# Patient Record
Sex: Male | Born: 1955 | Race: White | Hispanic: No | Marital: Married | Smoking: Never smoker
Health system: Southern US, Community
[De-identification: ages and names within clinical notes are randomized; demographics above are authoritative.]

## PROBLEM LIST (undated history)

## (undated) DIAGNOSIS — N138 Other obstructive and reflux uropathy: Secondary | ICD-10-CM

## (undated) DIAGNOSIS — E785 Hyperlipidemia, unspecified: Secondary | ICD-10-CM

## (undated) DIAGNOSIS — N5201 Erectile dysfunction due to arterial insufficiency: Secondary | ICD-10-CM

## (undated) DIAGNOSIS — R918 Other nonspecific abnormal finding of lung field: Secondary | ICD-10-CM

## (undated) DIAGNOSIS — F5104 Psychophysiologic insomnia: Secondary | ICD-10-CM

## (undated) DIAGNOSIS — M1990 Unspecified osteoarthritis, unspecified site: Secondary | ICD-10-CM

## (undated) DIAGNOSIS — K5730 Diverticulosis of large intestine without perforation or abscess without bleeding: Secondary | ICD-10-CM

## (undated) DIAGNOSIS — I700 Atherosclerosis of aorta: Secondary | ICD-10-CM

## (undated) DIAGNOSIS — I2510 Atherosclerotic heart disease of native coronary artery without angina pectoris: Secondary | ICD-10-CM

## (undated) HISTORY — DX: Psychophysiologic insomnia: F51.04

## (undated) HISTORY — DX: Diverticulosis of large intestine without perforation or abscess without bleeding: K57.30

## (undated) HISTORY — DX: Atherosclerotic heart disease of native coronary artery without angina pectoris: I25.10

## (undated) HISTORY — PX: APPENDECTOMY: SHX54

## (undated) HISTORY — PX: ANKLE SURGERY: SHX546

## (undated) HISTORY — DX: Hyperlipidemia, unspecified: E78.5

## (undated) HISTORY — DX: Other nonspecific abnormal finding of lung field: R91.8

## (undated) HISTORY — PX: CARPAL TUNNEL RELEASE: SHX101

## (undated) HISTORY — DX: Unspecified osteoarthritis, unspecified site: M19.90

## (undated) HISTORY — DX: Benign prostatic hyperplasia with lower urinary tract symptoms: N13.8

## (undated) HISTORY — PX: KNEE SURGERY: SHX244

## (undated) HISTORY — PX: CHOLECYSTECTOMY: SHX55

## (undated) HISTORY — DX: Atherosclerosis of aorta: I70.0

## (undated) HISTORY — DX: Erectile dysfunction due to arterial insufficiency: N52.01

---

## 1998-03-19 ENCOUNTER — Other Ambulatory Visit: Admission: RE | Admit: 1998-03-19 | Discharge: 1998-03-19 | Payer: Self-pay | Admitting: Family Medicine

## 2000-11-24 ENCOUNTER — Encounter: Admission: RE | Admit: 2000-11-24 | Discharge: 2001-02-08 | Payer: Self-pay | Admitting: Orthopaedic Surgery

## 2000-12-30 ENCOUNTER — Ambulatory Visit (HOSPITAL_COMMUNITY): Admission: RE | Admit: 2000-12-30 | Discharge: 2000-12-30 | Payer: Self-pay

## 2001-01-26 ENCOUNTER — Ambulatory Visit (HOSPITAL_BASED_OUTPATIENT_CLINIC_OR_DEPARTMENT_OTHER): Admission: RE | Admit: 2001-01-26 | Discharge: 2001-01-26 | Payer: Self-pay | Admitting: Orthopaedic Surgery

## 2001-02-08 ENCOUNTER — Encounter: Admission: RE | Admit: 2001-02-08 | Discharge: 2001-03-05 | Payer: Self-pay | Admitting: Orthopaedic Surgery

## 2001-07-11 ENCOUNTER — Inpatient Hospital Stay (HOSPITAL_COMMUNITY): Admission: AD | Admit: 2001-07-11 | Discharge: 2001-07-14 | Payer: Self-pay | Admitting: Psychiatry

## 2001-07-11 ENCOUNTER — Inpatient Hospital Stay (HOSPITAL_COMMUNITY): Admission: EM | Admit: 2001-07-11 | Discharge: 2001-07-11 | Payer: Self-pay

## 2001-07-20 ENCOUNTER — Encounter: Admission: RE | Admit: 2001-07-20 | Discharge: 2001-07-20 | Payer: Self-pay | Admitting: Psychiatry

## 2001-08-12 ENCOUNTER — Encounter: Admission: RE | Admit: 2001-08-12 | Discharge: 2001-08-12 | Payer: Self-pay | Admitting: *Deleted

## 2002-07-07 ENCOUNTER — Encounter (INDEPENDENT_AMBULATORY_CARE_PROVIDER_SITE_OTHER): Payer: Self-pay | Admitting: *Deleted

## 2002-07-07 ENCOUNTER — Ambulatory Visit (HOSPITAL_COMMUNITY): Admission: RE | Admit: 2002-07-07 | Discharge: 2002-07-07 | Payer: Self-pay | Admitting: Gastroenterology

## 2002-08-19 ENCOUNTER — Ambulatory Visit (HOSPITAL_COMMUNITY): Admission: RE | Admit: 2002-08-19 | Discharge: 2002-08-19 | Payer: Self-pay | Admitting: Otolaryngology

## 2002-08-19 ENCOUNTER — Encounter: Payer: Self-pay | Admitting: Otolaryngology

## 2003-12-30 ENCOUNTER — Inpatient Hospital Stay (HOSPITAL_COMMUNITY): Admission: AD | Admit: 2003-12-30 | Discharge: 2004-01-01 | Payer: Self-pay | Admitting: Internal Medicine

## 2003-12-30 ENCOUNTER — Encounter: Payer: Self-pay | Admitting: Emergency Medicine

## 2004-01-01 ENCOUNTER — Encounter (INDEPENDENT_AMBULATORY_CARE_PROVIDER_SITE_OTHER): Payer: Self-pay | Admitting: *Deleted

## 2004-04-21 ENCOUNTER — Emergency Department (HOSPITAL_COMMUNITY): Admission: EM | Admit: 2004-04-21 | Discharge: 2004-04-21 | Payer: Self-pay | Admitting: Emergency Medicine

## 2004-06-25 ENCOUNTER — Ambulatory Visit (HOSPITAL_COMMUNITY): Admission: RE | Admit: 2004-06-25 | Discharge: 2004-06-25 | Payer: Self-pay | Admitting: Orthopaedic Surgery

## 2004-06-25 ENCOUNTER — Ambulatory Visit (HOSPITAL_BASED_OUTPATIENT_CLINIC_OR_DEPARTMENT_OTHER): Admission: RE | Admit: 2004-06-25 | Discharge: 2004-06-25 | Payer: Self-pay | Admitting: Orthopaedic Surgery

## 2006-06-19 ENCOUNTER — Ambulatory Visit: Payer: Self-pay | Admitting: Cardiology

## 2006-07-06 ENCOUNTER — Ambulatory Visit: Payer: Self-pay | Admitting: Cardiology

## 2006-07-08 ENCOUNTER — Inpatient Hospital Stay (HOSPITAL_BASED_OUTPATIENT_CLINIC_OR_DEPARTMENT_OTHER): Admission: RE | Admit: 2006-07-08 | Discharge: 2006-07-08 | Payer: Self-pay | Admitting: Cardiovascular Disease

## 2006-07-08 ENCOUNTER — Ambulatory Visit: Payer: Self-pay | Admitting: Cardiovascular Disease

## 2006-08-18 ENCOUNTER — Emergency Department (HOSPITAL_COMMUNITY): Admission: EM | Admit: 2006-08-18 | Discharge: 2006-08-18 | Payer: Self-pay | Admitting: Emergency Medicine

## 2006-08-19 ENCOUNTER — Ambulatory Visit (HOSPITAL_COMMUNITY): Admission: RE | Admit: 2006-08-19 | Discharge: 2006-08-19 | Payer: Self-pay | Admitting: Family Medicine

## 2006-09-10 ENCOUNTER — Encounter (INDEPENDENT_AMBULATORY_CARE_PROVIDER_SITE_OTHER): Payer: Self-pay | Admitting: Specialist

## 2006-09-10 ENCOUNTER — Ambulatory Visit (HOSPITAL_COMMUNITY): Admission: RE | Admit: 2006-09-10 | Discharge: 2006-09-11 | Payer: Self-pay | Admitting: Surgery

## 2006-09-24 ENCOUNTER — Ambulatory Visit (HOSPITAL_COMMUNITY): Admission: RE | Admit: 2006-09-24 | Discharge: 2006-09-24 | Payer: Self-pay | Admitting: Surgery

## 2008-03-07 ENCOUNTER — Ambulatory Visit (HOSPITAL_BASED_OUTPATIENT_CLINIC_OR_DEPARTMENT_OTHER): Admission: RE | Admit: 2008-03-07 | Discharge: 2008-03-07 | Payer: Self-pay | Admitting: Orthopaedic Surgery

## 2008-07-19 ENCOUNTER — Ambulatory Visit (HOSPITAL_COMMUNITY): Admission: RE | Admit: 2008-07-19 | Discharge: 2008-07-19 | Payer: Self-pay | Admitting: Gastroenterology

## 2008-08-15 ENCOUNTER — Ambulatory Visit (HOSPITAL_BASED_OUTPATIENT_CLINIC_OR_DEPARTMENT_OTHER): Admission: RE | Admit: 2008-08-15 | Discharge: 2008-08-15 | Payer: Self-pay | Admitting: Orthopaedic Surgery

## 2008-09-04 IMAGING — CR DG ABDOMEN ACUTE W/ 1V CHEST
3 series · 3 of 3 positions shown · non-contrast
Comparison: none

CLINICAL DATA: Epigastric pain.
 ABDOMEN ACUTE WITH PA CHEST:
 Chest:  A single view of the chest shows the lungs to be clear. The heart is within normal limits in size.  
 Abdomen:  Supine and erect views of the abdomen show both large and small bowel gas to be present.  No obstruction is seen although there are a few scattered air-fluid levels throughout small bowel.  There is a moderate amount of feces throughout the colon.  No free air is seen on the erect view of the chest.  No opaque calculi are noted.

[w chest pa]
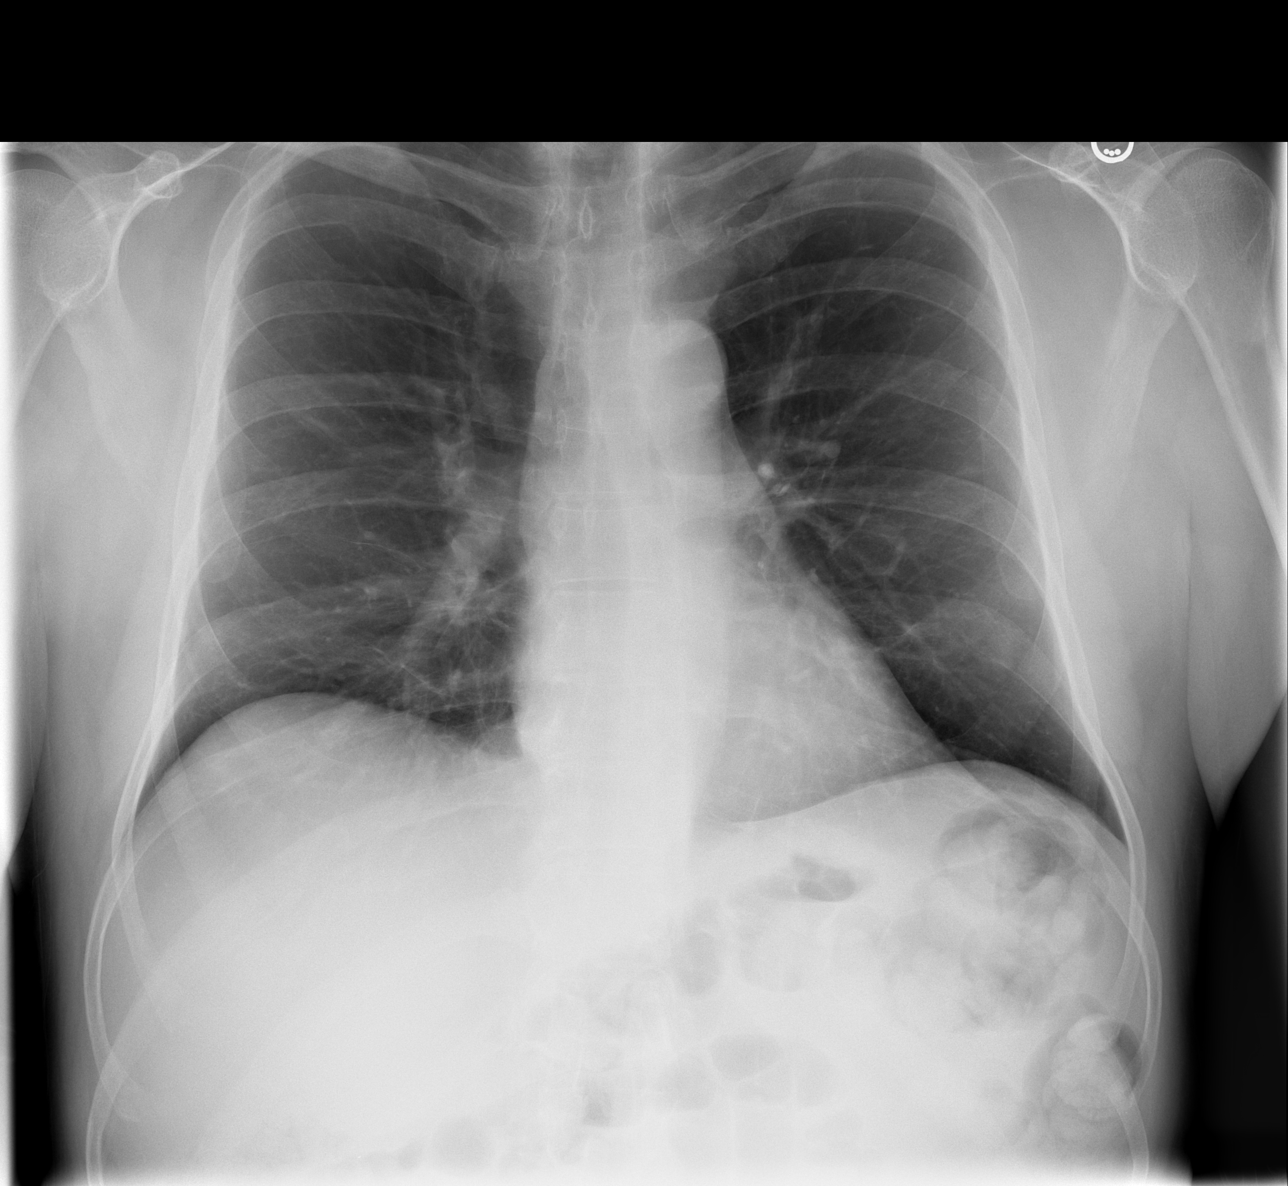

[w abdomen upright]
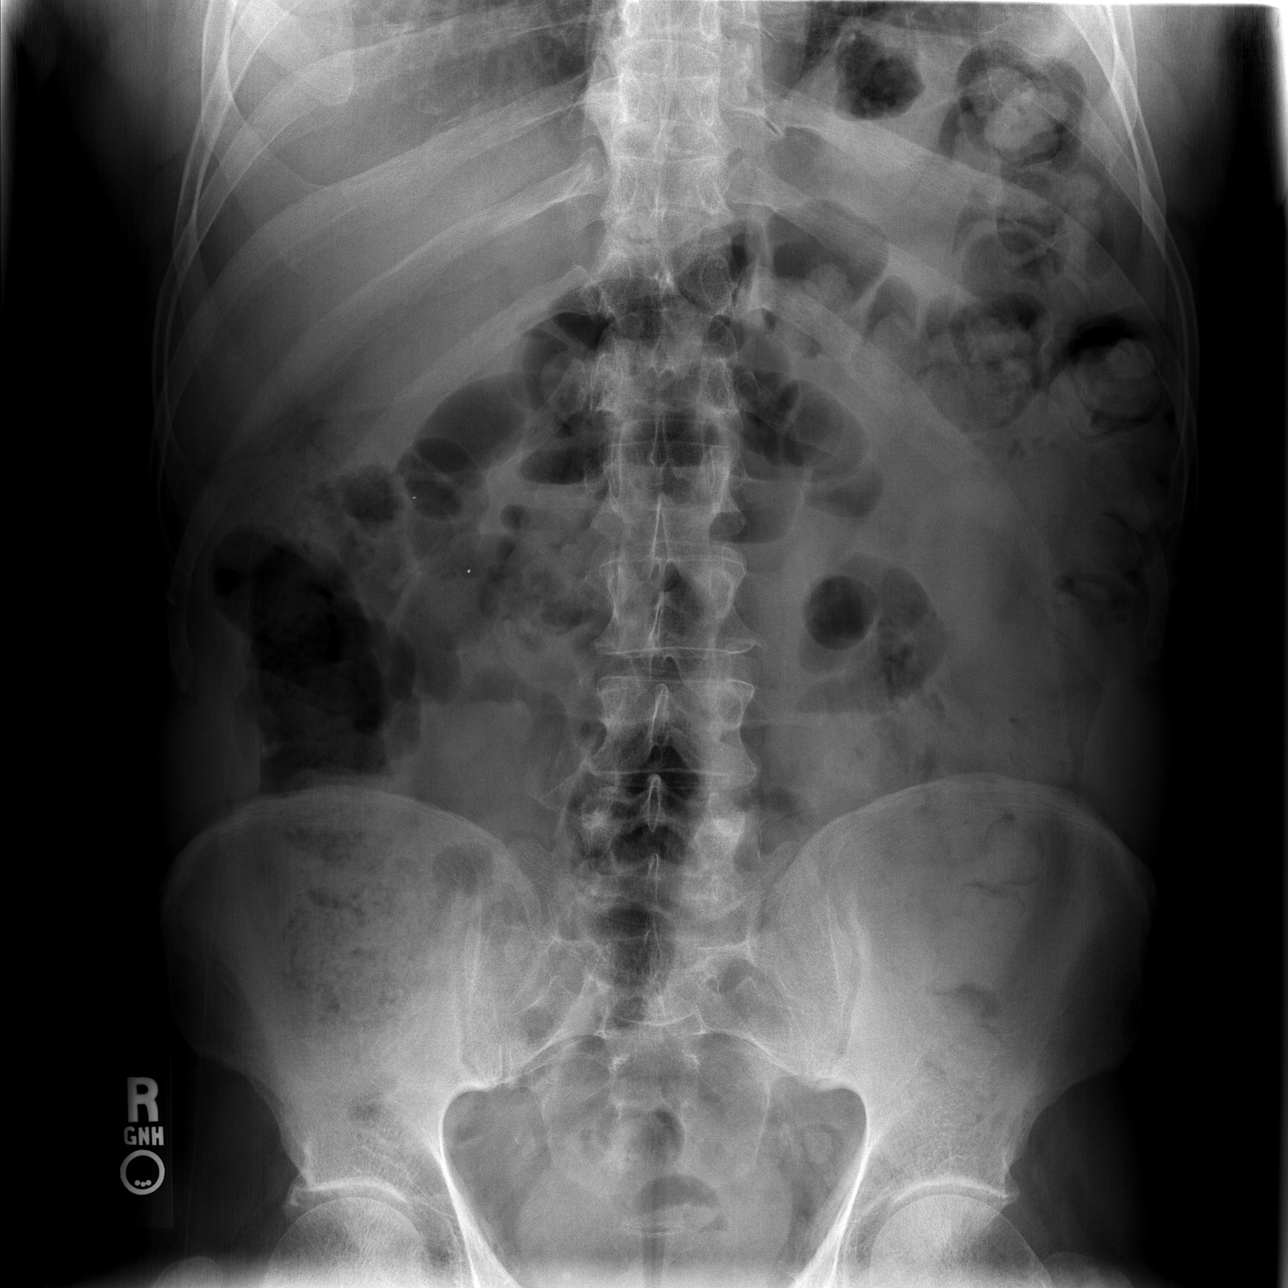

[t abdomen supine]
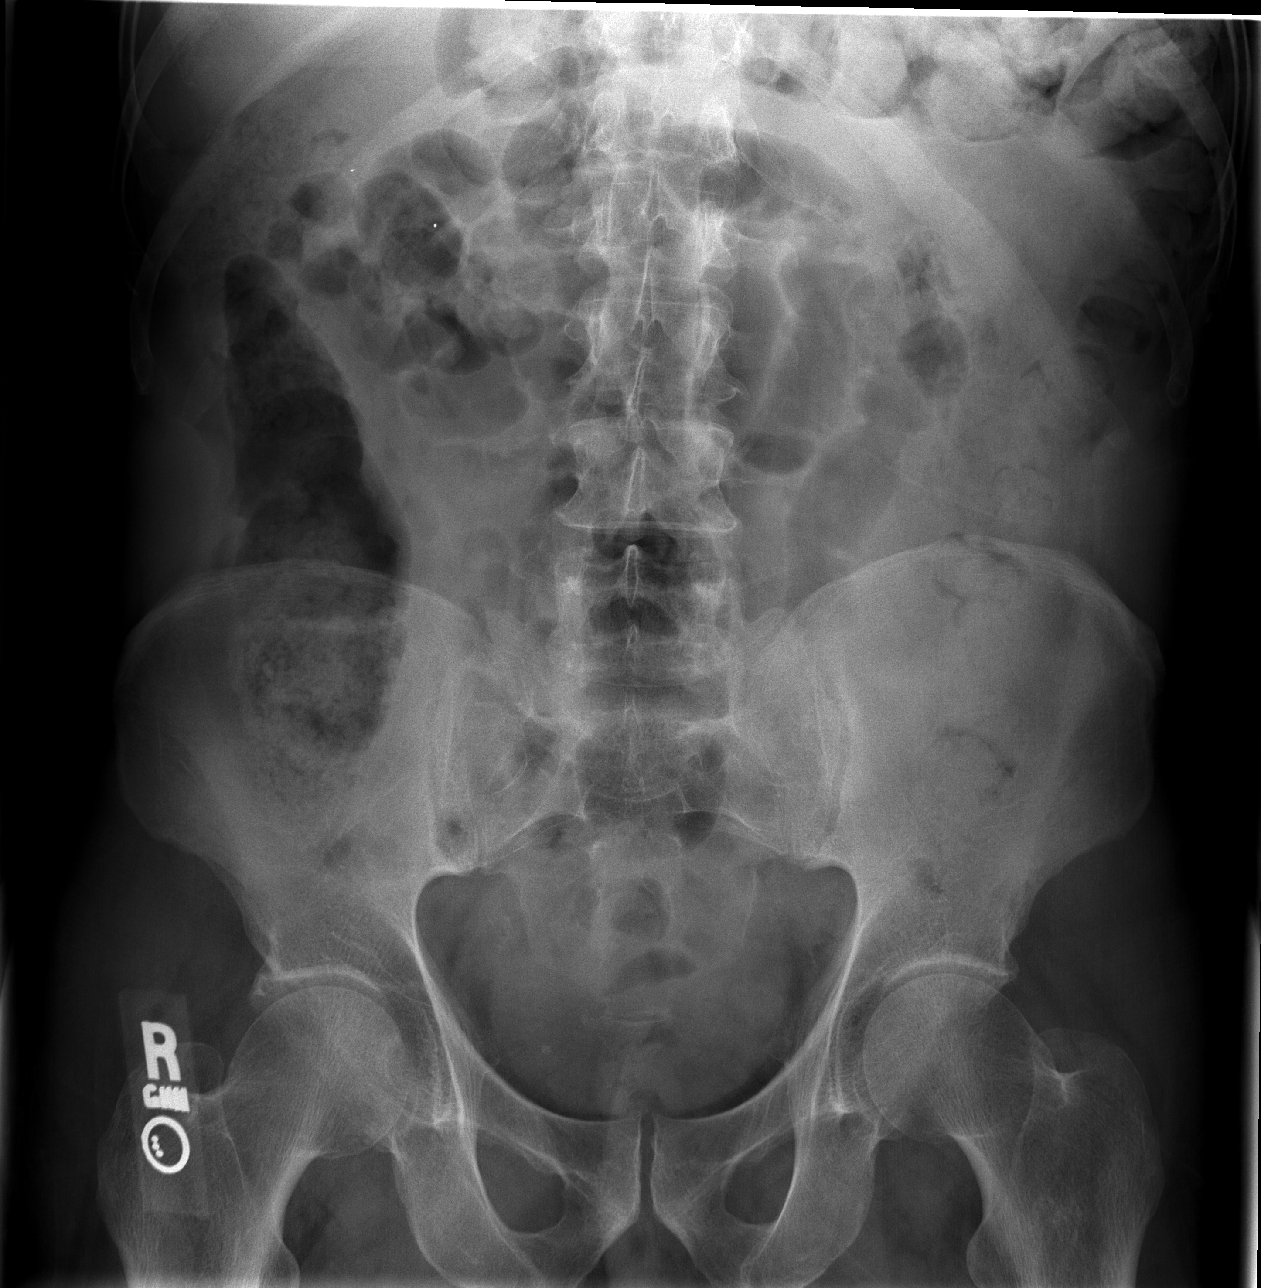

[3 of 3 positions shown; findings below may reference images not displayed]

IMPRESSION: 1.  No active lung disease.
 2.  No obstruction or free air.  Few scattered air-fluid levels in small bowel of questionable significance.  Question ileus.

## 2009-09-13 ENCOUNTER — Ambulatory Visit (HOSPITAL_BASED_OUTPATIENT_CLINIC_OR_DEPARTMENT_OTHER): Admission: RE | Admit: 2009-09-13 | Discharge: 2009-09-13 | Payer: Self-pay | Admitting: Orthopaedic Surgery

## 2010-01-24 ENCOUNTER — Observation Stay (HOSPITAL_COMMUNITY): Admission: EM | Admit: 2010-01-24 | Discharge: 2010-01-25 | Payer: Self-pay | Admitting: Emergency Medicine

## 2010-01-24 ENCOUNTER — Ambulatory Visit: Payer: Self-pay | Admitting: Internal Medicine

## 2010-03-07 ENCOUNTER — Emergency Department (HOSPITAL_COMMUNITY): Admission: EM | Admit: 2010-03-07 | Discharge: 2010-03-08 | Payer: Self-pay | Admitting: Emergency Medicine

## 2010-03-23 ENCOUNTER — Emergency Department (HOSPITAL_COMMUNITY): Admission: EM | Admit: 2010-03-23 | Discharge: 2010-03-24 | Payer: Self-pay | Admitting: Emergency Medicine

## 2010-03-23 ENCOUNTER — Ambulatory Visit (HOSPITAL_COMMUNITY): Admission: RE | Admit: 2010-03-23 | Discharge: 2010-03-23 | Payer: Self-pay | Admitting: Psychiatry

## 2010-03-24 ENCOUNTER — Inpatient Hospital Stay (HOSPITAL_COMMUNITY): Admission: AD | Admit: 2010-03-24 | Discharge: 2010-03-25 | Payer: Self-pay | Admitting: Psychiatry

## 2010-03-24 ENCOUNTER — Ambulatory Visit: Payer: Self-pay | Admitting: Psychiatry

## 2010-11-09 LAB — CBC
Hemoglobin: 12.6 g/dL — ABNORMAL LOW (ref 13.0–17.0)
MCH: 34.5 pg — ABNORMAL HIGH (ref 26.0–34.0)
MCV: 98.8 fL (ref 78.0–100.0)
RBC: 3.65 MIL/uL — ABNORMAL LOW (ref 4.22–5.81)
RDW: 13.7 % (ref 11.5–15.5)
WBC: 7.7 10*3/uL (ref 4.0–10.5)

## 2010-11-09 LAB — DIFFERENTIAL
Basophils Absolute: 0 10*3/uL (ref 0.0–0.1)
Eosinophils Relative: 2 % (ref 0–5)
Monocytes Absolute: 0.8 10*3/uL (ref 0.1–1.0)
Monocytes Relative: 11 % (ref 3–12)
Neutrophils Relative %: 68 % (ref 43–77)

## 2010-11-09 LAB — URINALYSIS, ROUTINE W REFLEX MICROSCOPIC
Bilirubin Urine: NEGATIVE
Glucose, UA: NEGATIVE mg/dL
Ketones, ur: 15 mg/dL — AB
Leukocytes, UA: NEGATIVE
Protein, ur: NEGATIVE mg/dL
Specific Gravity, Urine: 1.013 (ref 1.005–1.030)
pH: 6.5 (ref 5.0–8.0)

## 2010-11-09 LAB — RAPID URINE DRUG SCREEN, HOSP PERFORMED
Barbiturates: NOT DETECTED
Benzodiazepines: NOT DETECTED

## 2010-11-09 LAB — BASIC METABOLIC PANEL
CO2: 27 mEq/L (ref 19–32)
Chloride: 100 mEq/L (ref 96–112)
Creatinine, Ser: 0.87 mg/dL (ref 0.4–1.5)
GFR calc Af Amer: >60 mL/min (ref >60)
Potassium: 3.9 mEq/L (ref 3.5–5.1)
Sodium: 134 mEq/L — ABNORMAL LOW (ref 135–145)

## 2010-11-09 LAB — ETHANOL: Alcohol, Ethyl (B): <5 mg/dL (ref 0–10)

## 2010-11-09 LAB — URINE MICROSCOPIC-ADD ON

## 2010-11-10 LAB — BASIC METABOLIC PANEL
BUN: 17 mg/dL (ref 6–23)
GFR calc Af Amer: >60 mL/min (ref >60)
GFR calc non Af Amer: >60 mL/min (ref >60)
Glucose, Bld: 97 mg/dL (ref 70–99)

## 2010-11-10 LAB — POCT HEMOGLOBIN-HEMACUE: Hemoglobin: 14.4 g/dL (ref 13.0–17.0)

## 2010-11-11 LAB — DIFFERENTIAL
Eosinophils Relative: 1 % (ref 0–5)
Lymphocytes Relative: 21 % (ref 12–46)
Lymphs Abs: 1.2 10*3/uL (ref 0.7–4.0)
Monocytes Absolute: 0.6 10*3/uL (ref 0.1–1.0)
Monocytes Relative: 11 % (ref 3–12)

## 2010-11-11 LAB — LIPID PANEL
Cholesterol: 173 mg/dL (ref 0–200)
LDL Cholesterol: 81 mg/dL (ref 0–99)
Triglycerides: 62 mg/dL (ref <150)
VLDL: 12 mg/dL (ref 0–40)

## 2010-11-11 LAB — BASIC METABOLIC PANEL
BUN: 8 mg/dL (ref 6–23)
Chloride: 102 mEq/L (ref 96–112)
Chloride: 96 mEq/L (ref 96–112)
GFR calc Af Amer: >60 mL/min (ref >60)
GFR calc non Af Amer: >60 mL/min (ref >60)
Glucose, Bld: 96 mg/dL (ref 70–99)
Glucose, Bld: 98 mg/dL (ref 70–99)
Potassium: 3.8 mEq/L (ref 3.5–5.1)
Potassium: 4.3 mEq/L (ref 3.5–5.1)

## 2010-11-11 LAB — CARDIAC PANEL(CRET KIN+CKTOT+MB+TROPI)
Relative Index: 2.9 — ABNORMAL HIGH (ref 0.0–2.5)
Troponin I: 0.04 ng/mL (ref 0.00–0.06)

## 2010-11-11 LAB — URINALYSIS, ROUTINE W REFLEX MICROSCOPIC
Bilirubin Urine: NEGATIVE
Ketones, ur: 15 mg/dL — AB
Specific Gravity, Urine: 1.017 (ref 1.005–1.030)
Urobilinogen, UA: 0.2 mg/dL (ref 0.0–1.0)

## 2010-11-11 LAB — CBC
HCT: 37.7 % — ABNORMAL LOW (ref 39.0–52.0)
Hemoglobin: 13.4 g/dL (ref 13.0–17.0)
RBC: 3.88 MIL/uL — ABNORMAL LOW (ref 4.22–5.81)
WBC: 5.9 10*3/uL (ref 4.0–10.5)

## 2010-11-11 LAB — VITAMIN B12: Vitamin B-12: 349 pg/mL (ref 211–911)

## 2010-11-11 LAB — CK TOTAL AND CKMB (NOT AT ARMC): Total CK: 101 U/L (ref 7–232)

## 2010-11-11 LAB — HEPATIC FUNCTION PANEL
ALT: 66 U/L — ABNORMAL HIGH (ref 0–53)
Indirect Bilirubin: 0.7 mg/dL (ref 0.3–0.9)
Total Protein: 6 g/dL (ref 6.0–8.3)

## 2010-11-11 LAB — POCT CARDIAC MARKERS
Myoglobin, poc: 57.3 ng/mL (ref 12–200)
Troponin i, poc: <0.05 ng/mL (ref 0.00–0.09)

## 2011-01-07 NOTE — Op Note (Signed)
[name], [name]              ACCOUNT NO.:  [id number]   MEDICAL RECORD NO.:  [id number]          PATIENT TYPE:  AMB   LOCATION:  ENDO                         FACILITY:  [facility]   PHYSICIAN:  Danise Edge, M.D.   DATE OF BIRTH:  01/25/1956   DATE OF PROCEDURE:  07/19/2008  DATE OF DISCHARGE:                               OPERATIVE REPORT   REFERRING PHYSICIAN:  Burnell Blanks, MD   PROCEDURE INDICATION:  [name] is a 55 year old male, born on  06-18-1956.  In 2003, [name] underwent a screening colonoscopy  with removal of tubular adenomatous colon polyps.  He is scheduled today  to undergo a surveillance colonoscopy with polypectomy to prevent colon  cancer.   MEDICATION ALLERGIES:  ERYTHROMYCIN, CODEINE, and ZOCOR.   PAST MEDICAL AND SURGICAL HISTORY:  1. Left carpal tunnel surgery.  2. Laparoscopic cholecystectomy.  3. Normal cardiac catheterization in 2007.  4. Left knee surgery.  5. Hypercholesterolemia.  6. Gout.  7. Appendectomy.   HABITS:  [name] does not smoke cigarettes or consume alcohol.   ENDOSCOPIST:  Danise Edge, MD   PREMEDICATION:  Fentanyl 100 mcg and Versed 10 mg.   PROCEDURE:  After obtaining informed consent, [name] was placed in  the left lateral decubitus position.  He received intravenous fentanyl  100 mcg and intravenous Versed 10 mg to achieve conscious sedation for  the procedure.  The patient's blood pressure, oxygen saturation, and  cardiac rhythm were monitored throughout the procedure and documented in  the medical record.   The Pentax pediatric colonoscope was introduced into the rectum and  advanced to the cecum.  A normal-appearing appendiceal orifice and  ileocecal valve were identified.  Colonic preparation for the exam today  was good.   Rectum normal.  Sigmoid colon and descending colon normal.  Splenic flexure normal.  Transverse colon normal.  Hepatic flexure normal.  Ascending colon  normal.  Cecum and ileocecal valve normal.   ASSESSMENT:  Normal surveillance proctocolonoscopy to the cecum.  No  endoscopic evidence for the presence of colorectal neoplasia.   RECOMMENDATIONS:  Repeat surveillance colonoscopy in approximately 5  years.           ______________________________  Danise Edge, M.D.     MJ/MEDQ  D:  07/19/2008  T:  07/19/2008  Job:  [id number]   cc:   Burnell Blanks, MD

## 2011-01-07 NOTE — Op Note (Signed)
[name] [name], [name] [name]              ACCOUNT NO.:  [id number]   MEDICAL RECORD NO.:  [id number]          PATIENT TYPE:  AMB   LOCATION:  DSC                          FACILITY:  [facility]   PHYSICIAN:  Lubertha Basque. Dalldorf, M.D.DATE OF BIRTH:  02/16/1956   DATE OF PROCEDURE:  08/15/2008  DATE OF DISCHARGE:                               OPERATIVE REPORT   PREOPERATIVE DIAGNOSIS:  Right carpal tunnel syndrome.   POSTOPERATIVE DIAGNOSIS:  Right carpal tunnel syndrome.   PROCEDURE:  Right carpal tunnel release.   ANESTHESIA:  General.   ATTENDING SURGEON:  Lubertha Basque. Jerl Santos, MD   ASSISTANT:  Lindwood Qua, PA   INDICATIONS FOR PROCEDURE:  The patient is a 55 year old man with a long  history of bilateral hand pain and numbness.  This has persisted despite  bracing and oral anti-inflammatories and several injections.  By nerve  conduction study done some time ago, he had findings of mild carpal  tunnel on this side.  He is status post a successful release on the  opposite side earlier this year and is offered the same procedure on the  right.  Informed operative consent was obtained after discussion of  possible complications of reaction to anesthesia, infection, and  neurovascular injury.   SUMMARY, FINDINGS, AND PROCEDURE:  Under general anesthesia through a  small palmar incision, a right carpal tunnel release was performed.  He  did have moderate thickening of the transverse carpal ligament.   DESCRIPTION OF PROCEDURE:  The patient was taken to the operating suite  where general anesthetic was applied without difficulty.  He was  positioned supine and prepped and draped in the normal sterile fashion.  After the administration of IV Kefzol, the right arm was elevated,  exsanguinated, tourniquet inflated about the forearm.  A small palmar  incision was made ulnar to the thenar flexion crease.  Dissection was  carried down through the palmar fascia to expose the transverse carpal  ligament.  This was released under direct visualization.  This was  moderately thickened and did appear to apply some pressure to the  underlying median nerve.  Dissection was taken distally to the  transverse arch of vessels and proximally through the distal fascia of  the forearm.  The wound was irrigated followed by reapproximation of  skin alone with vertical mattresses of nylon.  Some Marcaine was  injected followed by Adaptic and dry gauze dressing with a volar splint  and plaster with the wrist in slight extension.  Estimated blood loss  and intraoperative fluids as well as accurate tourniquet time could be  obtained from anesthesia records.  The tourniquet was deflated during  closure and his fingers all became pink and warm immediately.   DISPOSITION:  The patient was extubated in the operating room and taken  to the recovery room in stable addition.  He was to go home same-day and  follow up in my office next week.  I will contact him by phone tonight.      Lubertha Basque Jerl Santos, M.D.  Electronically Signed     PGD/MEDQ  D:  08/15/2008  T:  08/15/2008  Job:  [id number]

## 2011-01-07 NOTE — Op Note (Signed)
[name] [name], [name] [name]              ACCOUNT NO.:  [id number]   MEDICAL RECORD NO.:  [id number]          PATIENT TYPE:  AMB   LOCATION:  DSC                          FACILITY:  [facility]   PHYSICIAN:  Lubertha Basque. Dalldorf, M.D.DATE OF BIRTH:  01/01/1956   DATE OF PROCEDURE:  DATE OF DISCHARGE:                               OPERATIVE REPORT   PREOPERATIVE DIAGNOSIS:  Left carpal tunnel syndrome.   POSTOPERATIVE DIAGNOSIS:  Left carpal tunnel syndrome.   PROCEDURE:  Left carpal tunnel release.   ANESTHESIA:  General.   ATTENDING SURGEON:  Lubertha Basque. Jerl Santos, MD   ASSISTANT:  Lindwood Qua, PA   INDICATION FOR PROCEDURE:  The patient is a 55 year old man with a long  history of bilateral hand pain and numbness.  This has persisted despite  oral anti-inflammatories and bracing.  He has also had injections into  both carpal tunnels.  By nerve conduction study, he has moderate  compression of the nerve on the left side and at this point is offered  release.  Informed operative consent was obtained after discussion of  possible complications of reaction to anesthesia, infection, and  neurovascular injury.   SUMMARY, FINDINGS, AND PROCEDURE:  Under general anesthesia through a  small palmar incision, a left carpal tunnel release was performed.  He  did have moderate thickening of the transverse carpal ligament which did  appear to be applying some significant compression to the underlying  median nerve.   DESCRIPTION OF PROCEDURE:  The patient was in the operating suite where  general anesthetic was applied without difficulty.  He was positioned  supine and prepped and draped in normal sterile fashion.  After the  administration of IV Kefzol, the left arm was elevated, exsanguinated,  and tourniquet inflated out the forearm.  A small palmar incision was  made ulnar to the thenar flexion crease.  This did not cross the wrist  flexion crease.  Dissection was carried down to the transverse  carpal  ligament.  This was released under direct visualization with the distal  dissection going to the transverse arch of vessels and the proximal  dissection going through the distal fascia of the forearm.  The wound  was irrigated followed by reapproximation of skin with vertical  mattresses of nylon.  Some Marcaine was injected about the incision site  followed by Adaptic, dry gauze, and a volar splint of plaster with the  wrist in slight extension.  The tourniquet was deflated during closure,  and a small amount of bleeding was easily controlled Bovie cautery and  some pressure.  His fingers all became pink and warm immediately.  Estimated blood loss and fluids as well as accurate tourniquet time can  be obtained from anesthesia records.   DISPOSITION:  The patient was extubated in the operating room and taken  to the recovery room in stable addition.  He is to go home the same day  and follow up in the office in less than a week.  I will contact him by  phone tonight.      Lubertha Basque Jerl Santos, M.D.  Electronically Signed  PGD/MEDQ  D:  03/07/2008  T:  03/07/2008  Job:  [id number]

## 2011-01-10 NOTE — Consult Note (Signed)
NAMEJOHANNES, EVERAGE                          ACCOUNT NO.:  [id number]   MEDICAL RECORD NO.:  [id number]                   PATIENT TYPE:  INP   LOCATION:  4703                                 FACILITY:  [facility]   PHYSICIAN:  Pramod P. Pearlean Brownie, MD                 DATE OF BIRTH:  1955-11-08   DATE OF CONSULTATION:  12/30/2003  DATE OF DISCHARGE:                                   CONSULTATION   REFERRING PHYSICIAN:  [name], MD   REASON FOR REFERRAL:  Dizziness.   HISTORY OF PRESENT ILLNESS:  [name] is a 55 year old Caucasian male  who has been having intermittent spells of vertigo, nausea, and gait ataxia  for the last two weeks.  The patient states his symptoms began abruptly and  he has noted dizziness mainly when he tries to get up and move suddenly;  however, this vertigo seems to last a couple of minutes, even after he has  sat down and gradually settles down.  For the last two weeks this was  occurring 2-3 times a day, but for the last couple of days it has been  occurring continuously.  He denies a sense of true vertigo with the room  spinning as well as nausea and gait and truncal ataxia.  He needs to sit  down and lie down.  He was able to drive himself, but he vomited once.  Since arrival in the emergency room, his symptoms have persisted and any  abrupt movement seems to bring on the vertigo.  He has no prior history of  stroke, TIA, or any neurological problems.   PAST MEDICAL HISTORY:  1. Significant for osteoarthritis.  2. Gout.  3. Hyperlipidemia.  4. Hiatal hernia.   PAST SURGICAL HISTORY:  1. Arthroscopic knee surgery.  2. Sinus surgery.  3. Appendectomy.   SOCIAL HISTORY:  The patient does not smoke.  He drinks alcohol  occasionally.  He works on a chicken farm and Probation officer.  [place]   FAMILY HISTORY:  Not significant for any neurological problems.   ALLERGIES:  1. ERYTHROMYCIN.  2. CODEINE.  3.  ZOCOR.   HOME MEDICATIONS:  1. Lipitor.  2. Allopurinol.  3. Mobic.  4. Trazodone.  5. Oxycodone p.r.n.   REVIEW OF SYSTEMS:  Not significant for recent chest pain, fever, cough,  diarrhea, or shortness of breath.   PHYSICAL EXAMINATION:  GENERAL:  Reveals a pleasant, elderly-looking young  male not in distress.  VITAL SIGNS:  Afebrile.  Pulse rate is about 52 per minute, regular.  Blood  pressure 146/92, respiratory rate 20 per minute.  The patient's pulse was 46  earlier today when recorded in the ER.  Distal pulses are well felt.  HEENT:  Head is nontraumatic.  ENT exam is unremarkable.  NECK:  Supple without bruit.  CARDIAC:  No murmur or gallop.  LUNGS:  Clear to auscultation.  ABDOMEN:  Soft, nontender.  NEUROLOGIC:  The patient is awake, alert, oriented x3, with normal speech  and language function.  There is no aphasia or partial dysarthria.  Both  pupils are equal and reactive.  Eye movements are full range.  The patient  has horizontal visual nystagmus when he looks to the left with a fast  component beating outward.  The nystagmus disappears when he looks to the  right or up or down.  There is no visual field loss.  Visual acuity is  adequate.  Face is symmetric bilaterally.  Tongue is midline.  Motor system  exam revealed symmetric upper and lower extremities, tone, reflexes, and  sensation.  Plantars were downgoing.  The patient has minimal left finger-to-  nose ataxia.  Rapid alternating movements are slow on the left, but there is  no clear dysarthria.  Lower extremity coordination seems adequate.  The  patient has severe truncal ataxia, and when he sits up his vertigo is also  reproduced, and the left beating nystagmus seems to be more pronounced.  His  gait was not tested.   Noncontrast CT scan of the head done today reveals no acute abnormalities.  WBC count, blood chemistries and liver enzymes were all normal.   IMPRESSION:  A 55 year old gentleman with a  2-week history of intermittent  positional vertigo, which certainly sounds like peripheral labyrinth  dysfunction; however, the presence of focal left beating nystagmus as well  as minimal dysarthria should also point toward a small lower left brainstem  lesion.  Given his risk factors of hyperlipidemia, certainly a small stroke  is a consideration, despite negative CT scan.   PLAN:  I agree with further stroke work up with MRI scan of the brain, with  MRA of the brain and neck, as well as checking Doppler studies and  echocardiogram.  Check a fasting lipid profile, hemoglobin A1C, B-12 and  homocystine levels.  Treat his dizziness with meclizine 25 mg q.6h.  IV  hydration with normal saline.  Physical therapy for gait and balance  training.   I will be happy to follow this patient on consult, and kindly call for  questions.                                               Pramod P. Pearlean Brownie, MD    PPS/MEDQ  D:  12/30/2003  T:  12/30/2003  Job:  [id number]

## 2011-01-10 NOTE — Op Note (Signed)
NAMEMORGEN, RITACCO              ACCOUNT NO.:  [id number]   MEDICAL RECORD NO.:  [id number]          PATIENT TYPE:  AMB   LOCATION:  DSC                          FACILITY:  [facility]   PHYSICIAN:  Lubertha Basque. Dalldorf, M.D.DATE OF BIRTH:  1956-08-06   DATE OF PROCEDURE:  06/25/2004  DATE OF DISCHARGE:                                 OPERATIVE REPORT   PREOPERATIVE DIAGNOSIS:  1.  Left knee torn medial meniscus.  2.  Left knee chondromalacia patella.   POSTOPERATIVE DIAGNOSIS:  1.  Left knee torn lateral meniscus.  2.  Left knee loose bodies.  3.  Left knee chondromalacia.   PROCEDURES:  1.  Left knee partial lateral meniscectomy.  2.  Left knee removal loose bodies and chondroplasty.  3.  Arthroscopic lateral release left knee.   ANESTHESIA:  General.   ATTENDING SURGEON:  Marcene Corning, M.D.   ASSISTANT:  Lindwood Qua, P.A.   INDICATION FOR PROCEDURE:  The patient is a 55 year old male with a long  history of bilateral knee pain.  He is status post successful operation on  the opposite side several years ago.  He has developed mechanical symptoms  on the left which have persisted despite oral anti-inflammatories and  injectable cortisone as well as several series of Synvisc injections.  He  has pain at rest and pain which limits his ability to remain active and is  offered an arthroscopy.  Informed operative consent was obtained after  discussion of possible complications of reaction to anesthesia and  infection.   DESCRIPTION OF PROCEDURE:  The patient was taken to the operating suite  where a general anesthetic was applied without difficulty.  He was  positioned supine and prepped and draped in a normal sterile fashion.  After  the administration of __________ IV antibiotics, we performed an arthroscopy  through three portals.  Suprapatellar pouch was benign while patellofemoral  joint appeared to have intact cartilage.  Initially this cartilage looked  worn, bare  but upon probing it looked as though he had abnormal but intact  cartilage over the joint.  The patellofemoral joint was quite tight and  tracked in a slight lateral position so we added a lateral release through  the additional third portal.  This was done with an arthroscopic Bovie.  The  pump pressure was decreased and a small amount of bleeding was easily  controlled with the cautery portion of this probe.  After this was  accomplished, the kneecap tracked in a better position with much less  tension on the joint.  Medial compartment exhibited no evidence of meniscal  damage.  Lateral compartment appeared to have  a posterior horn lateral  meniscus tear __________ about 5% partial lateral meniscectomy.  Again the  cartilage in the medial and lateral compartments also appeared abnormal but  intact.  The ACL was intact.  He had a conglomeration of loose bodies in the  anterior aspect of the joint which I removed and these seemed to be  cartilaginous in nature.  He had another one in the lateral gutter which we  took out.  The  knee was thoroughly irrigated followed by placement of  Marcaine with epinephrine and morphine.  Some Adaptic was placed over the  portals followed by dry gauze and a loose Ace wrap.  Estimated blood loss,  intraoperative fluids can be obtained from anesthesia records.   DISPOSITION:  The patient was extubated in the operating room, taken to the  recovery room in stable condition.   PLANS:  For him to go home the same day and to followup in the office in one  week.  I will contact him by phone tonight.       PGD/MEDQ  D:  06/25/2004  T:  06/25/2004  Job:  [id number]

## 2011-01-10 NOTE — Discharge Summary (Signed)
[facility]  Patient:    [name] [name], [name] [name] Visit Number: [id number] MRN: [id number]          Service Type: PSY Location: 500 1027 02 Attending Physician:  Jeanice Lim Dictated by:   Jeanice Lim, M.D. Admit Date:  07/11/2001 Discharge Date: 07/14/2001                             Discharge Summary  IDENTIFYING DATA:  This is a 55 year old Caucasian male voluntarily admitted after being transferred from overnight observation at [facility] following an overdose on 14 tablets of Ambien and after drinking some alcohol.  MEDICATIONS:  Allopurinol, ____, colchicine.  DRUG ALLERGIES:  CODEINE, ERYTHROMYCIN.  PHYSICAL EXAMINATION:  Essentially within normal limits except slight tenderness of the left elbow.  Otherwise unremarkable.  Neurologically nonfocal.  LABORATORY DATA:  Routine admission labs were essentially within normal limits including a thyroid profile with an elevated T3 uptake at 37.3, slightly elevated uric acid was 39.  MENTAL STATUS EXAMINATION:  Fully alert, Caucasian male in no acute distress. Pleasant, polite, having moderate amount of pain in his left arm.  No tremulousness.  Maintaining good eye contact.  Speech within normal limits. Mood depressed.  Affect restricted.  Thought processes goal directed.  Thought content with significant denial regarding stressors including from his marriage and the severity of his depression.  No psychotic symptoms.  No suicidal or homicidal ideation.  He was tearful and cognitively intact.  ADMISSION DIAGNOSES: Axis I:    1. Major depression, single episode.            2. Alcohol abuse. Axis II:   None. Axis III:  1. Acute gouty arthritis in left elbow.            2. Hyperlipidemia. Axis IV:   Moderate (problems with primary support). Axis V:    32/78.  HOSPITAL COURSE:  The patient was ordered routine p.r.n. medications.  A phenobarbital detox protocol for safe withdrawal  and allopurinol and Lipitor were resumed as well as colchicine for five doses for acute gouty arthritis. Prozac was started and phenobarbital protocol was discontinued due to the lack of withdrawal symptoms and he was monitored for continued withdrawal.  The patient was given Indocin 50 mg t.i.d. for two days and Gatorade was encouraged.  Trazodone was optimized to restore sleep and patient tolerated medication changes well, responded regarding his left elbow pain.  CONDITION ON DISCHARGE:  Improved.  His mood was more euthymic.  Affect brighter.  Thought processes goal directed.  Thought content negative for psychotic symptoms.  There is no dangerous ideation.  Judgment and insight had improved.  He reported motivation to be compliant with the follow-up plan.  FOLLOW-UP:  Dr. Lourdes Sledge July 29, 2001 at 10:30 a.m.  DISCHARGE MEDICATIONS: 1. Allopurinol as directed by his primary care physician. 2. Indomethacin 50 mg t.i.d. for five days. 3. Prozac 20 mg q.a.m. 4. Trazodone 50 mg q.h.s.  DISCHARGE DIAGNOSES: Axis I:    1. Major depression, single episode.            2. Alcohol abuse. Axis II:   None. Axis III:  1. Acute gouty arthritis in left elbow.            2. Hyperlipidemia. Axis IV:   Moderate (problems with primary support). Axis V:    Global Assessment of Functioning on discharge 54. Dictated by:   Jeanice Lim, M.D.  Attending Physician:  Jeanice Lim DD:  09/05/01 TD:  09/06/01 Job: [id number]

## 2011-01-10 NOTE — Op Note (Signed)
[name], [name]              ACCOUNT NO.:  [id number]   MEDICAL RECORD NO.:  [id number]          PATIENT TYPE:  OIB   LOCATION:  0098                         FACILITY:  [facility]   PHYSICIAN:  [name] [name], M.D.DATE OF BIRTH:  July 03, 1956   DATE OF PROCEDURE:  09/10/2006  DATE OF DISCHARGE:                               OPERATIVE REPORT   OFFICE MEDICAL:  [facility] [phone].   PREOPERATIVE DIAGNOSIS:  Gallstones with chronic cholecystitis.   POSTOPERATIVE DIAGNOSIS:  Gallstones with chronic cholecystitis.   OPERATION:  Laparoscopic cholecystectomy with operative cholangiogram.   SURGEON:  Dr. Jamey [name]   ASSISTANT:  Dr. Harriette [name]   ANESTHESIA:  General endotracheal.   CLINICAL HISTORY:  This is a 55 year old gentleman who is having biliary-  type symptoms and a finding of gallstones.  He elected to proceed to  cholecystectomy.   DESCRIPTION OF PROCEDURE:  The patient was seen in the holding area, and  he had no further questions.  We confirmed that removal of the  gallbladder was the planned procedure.   The patient was taken to the operating room and after satisfactory  general endotracheal anesthesia had been obtained, the abdomen was  clipped, prepped, and draped.  The time-out occurred.   Marcaine 0.25% plain was used for each incision.  The umbilical incision  was made, the fascia opened, and the peritoneal cavity entered.  A  pursestring was placed, the Hasson introduced and, the abdomen  insufflated to 15.   No gross abnormalities were noted.  The gallbladder appeared a little  bit chronically thickened.   With the patient in reverse Trendelenburg and tilted to the left, a  10/11 trocar was placed in the epigastrium and two 5 mm laterally.  Some  omental adhesions were taken down, and the area of the perineum over the  triangle of Calot was opened, and I could identify nicely the cystic  artery and the cystic duct and a long length of each.   The cystic  duct was clipped once near the junction with the gallbladder  and the cystic artery likewise clipped once.  The cystic duct was opened  and a Cook catheter introduced percutaneously and held into the cystic  duct with a clip.  Operative cholangiography showed good filling of the  common duct and duodenum with normal anatomy and no filling defects.   The cystic duct catheter was removed and 3 clips placed on the stay side  of the cystic duct.  The duct was divided.  Two additional clips were  placed on the cystic artery, and it was divided.  The gallbladder was  removed from below to above with coagulation current of the cautery.  It  was placed in a bag and initially as we were getting ready to irrigate,  we noted that somehow the locking mechanism in the bag had not worked,  and the gallbladder had escaped out of the bag.  The new bag was placed,  the gallbladder and the malfunctioning bag placed into the new bag and  subsequently pulled out the umbilical port.   Once everything was irrigated and  we made sure everything was dry, the  lateral ports were removed under direct vision.  The umbilical site was  closed with the pursestring.  The abdomen was deflated through the  epigastric port.  Skin was closed with 4-0 Monocryl subcuticular plus  Dermabond.  I then put a 0 Vicryl fascial suture at the epigastric port  as well.   The patient tolerated the procedure well.  There were no operative  complications.  All counts were correct.      [name] [name], M.D.  Electronically Signed     CJS/MEDQ  D:  09/10/2006  T:  09/10/2006  Job:  [id number]

## 2011-01-10 NOTE — H&P (Signed)
[facility]  Patient:    [name] [name], [name] [name] Visit Number: [id number] MRN: [id number]          Service Type: PSY Location: 500 [id number] [id number] Attending Physician:  Rachael Fee Dictated by:   Young Berry Scott, R.N. N.P. Admit Date:  07/11/2001                     Psychiatric Admission Assessment  DATE OF ADMISSION:  July 11, 2001  DATE OF ASSESSMENT:  July 12, 2001, at 8:45 a.m.  PATIENT IDENTIFICATION:  This is a 55 year old white male who is a voluntary admission after being transferred from overnight observation at [facility].  HISTORY OF PRESENT ILLNESS:  This patient presented to the [facility] Emergency Department after taking 14 tablets of Ambien 10 mg and after drinking some beer.  The patient says he wanted to die at the time and then immediately regretted it and told his wife who took him to the emergency room. The patient reports that he has been unable to sleep for approximately the past two to four months, sleeping only three to four hours a night, and was started on Ambien by his primary care Shirl Ludington and he feels that the Ambien "caused his depression."  The patient reports gradual decrease in his sleep along with increasing anhedonia over the past two years with exacerbation of symptoms over the past two to three months, and denies any previous past history of depression.  He endorses decreased concentration, increased sadness and worry for the past two to three months along with increasing anergia and anhedonia.  He cites the stress of emotional loneliness within his marriage along with work stress and the recent death of an acquaintance.  The patient is also concerned about his use of alcohol and drinks approximately one half of a case of beer per day for the past several years.  However, the patient denies any ETOH withdrawal symptoms and recently took himself off alcohol prior to knee surgery in June 2002 for 15 days  with no withdrawal symptoms. The patient denies any homicidal ideation.  He denies any auditory or visual hallucinations.  PAST PSYCHIATRIC HISTORY:  None.  The patient denies any prior history of depression prior to the past two years.  He has no history of prior suicide attempts.  He has never seen a psychiatrist, no prior inpatient treatment.  SUBSTANCE ABUSE HISTORY:  The patients alcohol level was 145 in the emergency room.  He drinks approximately one half of a case of beer per day for the last several years.  He is a nonsmoker.  PAST MEDICAL HISTORY:  The patient is followed by [name] in [place], [place], as his primary care Nilsa Macht.  Medical problems include current attack of acute gouty arthritis in his left elbow and hypercholesterolemia, osteoarthritis of his right knee.  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  MEDICATIONS: 1. Allopurinol 300 mg p.o. q.d. 2. Lipitor 20 mg p.o. q.h.s. 3. Colchicine 0.6 mg p.o. q.6h. p.r.n. for gout.  DRUG ALLERGIES:  CODEINE, E-MYCIN.  PHYSICAL EXAMINATION:  GENERAL:  The patients PE was done on admission to the [facility] and notes show nothing remarkable.  Today he is experiencing significant acute pain in his left elbow with some tenderness to palpation and inability to move it.  He denies any chest  pain, denies shortness of breath, no diaphoresis.  He does have some mild swelling in his left arm and fingers. His pain is radiating up to the shoulder but only when he moves the joint.  He has acute pain with joint motion and acute tenderness to palpation.  He has no tenderness in the shoulder.  He has no jaw, lower face, or back pain.  He is unable to move the elbow.  He can move the left shoulder with some difficulty.  VITAL SIGNS:  Blood  pressure 142/96 sitting and pulse 74, blood pressure standing 167/95, pulse 74 standing.  He is afebrile.  LABORATORY DATA:  Salicylate levels were less than 4 in the emergency room. CBC was within normal limits.  SGPT was mildly elevated at 42.  Creatinine 1.2, BUN 13.  Urine drug screen was negative.  Thyroid panel is currently pending.  SOCIAL HISTORY:  The patient has been married for the past 15 years.  He has two current children ages 13 and 59.  He also has a 35 year old daughter by a previous marriage to whom he is quite close.  The patient reports that his marriage is solid there is little emotional intimacy, which is misses.  FAMILY HISTORY:  The patient denies.  MENTAL STATUS EXAMINATION:  This is a fully alert white male who is in no acute distress.  He is pleasant and polite.  He is having a moderate amount of pain in his left arm, probably related to gout in his left elbow but his manner is calm.  He is not tremulous, no signs of withdrawal.  He has good eye contact.  Speech is normal and relevant.  Mood is depressed.  Thought process is logical and goal directed.  There is a significant denial component regarding his depression and his stresses, both within his marriage and within his work which are primary stress factors for him.  There is no evidence of psychosis.  He has no dangerous ideations today but does remain with a tearful and constricted affect.  Mood is definitely depressed.  Cognitive: Intact and oriented x 4.  ADMISSION DIAGNOSES: Axis I:    1. Major depression, single episode.            2. Ethyl alcohol abuse, rule out dependence. Axis II:   Deferred. Axis III:  1. Acute gouty arthritis of his left elbow.            2. Hyperlipidemia. Axis IV:   Moderate problems with the primary support group, specifically            stress within his marriage and job stress. Axis V:    Current 32, past year 46.  INITIAL PLAN OF CARE:  Plan is to voluntarily admit  the patient to stabilize his mood and to alleviate his suicidal ideation.  Our goal is to alleviate his  neurovegetative symptoms and his suicidal ideation and improve his sleep at night.  We will plan for a family session with his wife as soon as possible. Meanwhile, we have elected to start him on Prozac 20 mg p.o. daily and trazodone 50 mg at h.s.  We have placed him on phenobarbital 30 mg p.o. q.i.d. p.r.n. for his potential alcohol withdrawal but given the fact that he has not had any symptoms when he has attempted to withdraw on his own, we feel that providing phenobarbital p.r.n. at this time will be adequate and we will monitor him for any signs of withdrawal.  Meanwhile,  for his acute gouty arthritis attack, we will give him colchicine 0.5 mg every hour p.r.n. for four more doses until his pain is relieved and/or he gets some diarrhea and then we will reevaluate his pain after that.  ESTIMATED LENGTH OF STAY:  Two to four days. Dictated by:   Young Berry Scott, R.N. N.P. Attending Physician:  Rachael Fee DD:  07/12/01 TD:  07/12/01 Job: [id number]

## 2011-01-10 NOTE — H&P (Signed)
[name] [name], [name] [name]                          ACCOUNT NO.:  [id number]   MEDICAL RECORD NO.:  [id number]                   PATIENT TYPE:  INP   LOCATION:  NA                                   FACILITY:  [facility]   PHYSICIAN:  [name], MD               DATE OF BIRTH:  [birth year]   DATE OF ADMISSION:  12/30/2003  DATE OF DISCHARGE:                                HISTORY & PHYSICAL   PRIMARY CARE PHYSICIAN:  [name]   CHIEF COMPLAINT:  Dizziness x24 hours.   BRIEF HISTORY:  This is a 55 year old white male with a past medical history  significant for only high cholesterol, no history of cardiovascular disease  or stroke who states that approximately 2 weeks ago he developed his first  episode of dizziness while driving, said he was sitting at a stop sign ready  to make a turn when pretty much the world around him started to spin, he  states he carefully made the turn and pulled over to the side and his  symptoms resolved spontaneously.  He states that since that time he has one  to two smaller episodes of dizziness that have resolved on their own.  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  He was unable to stand, he was falling to the side,  he states that he lay down on the floor in his hatching room for 45 minutes  until he was able to crawl to his car and drive himself back home at which  time he vomited x1.  He went to an Urgent Care Center and was evaluated  today and found to be very ataxic and to have nystagmus and was asked to  come to the emergency room for evaluation.   REVIEW OF SYSTEMS:  His review of systems significant for a decrease of  weight of about 30 pounds over the last 3 years which he attributes to a  discontinuation of regular alcohol consumption; otherwise, he states he has  had no headaches, palpitations, chest pain, occasionally he does have  epigastric discomfort from a hiatal hernia, he  denies melena or  hematochezia.  All other review of systems is negative.   PAST MEDICAL HISTORY:  Arthritis, gout, high cholesterol, and a hiatal  hernia.   PAST SURGICAL HISTORY:  He has had arthroscopic knee surgery 3 years ago on  his right knee, sinus surgery in February 2005, appendix was taken out in  1968.   SOCIAL HISTORY:  He denies tobacco.  He states he occasionally has alcohol  but this is only one or two drinks a week.   FAMILY HISTORY:  His mom is living with a history of MI and polyps.  His  father is living with a history of prostate cancer.  He has one sister who  is in good health.  He is married  with two girls and he owns a chicken farm.   ALLERGIES:  ERYTHROMYCIN, CODEINE, and ZOCOR.   MEDICATIONS:  1. Lipitor 10 mg at bedtime.  2. Allopurinol he states 0.8 mg every night.  3. Mobic unknown dosing.  4. Trazodone 12.5 mg once daily.  5. Oxycodone as needed.   PHYSICAL EXAMINATION:  VITALS:  Vital signs are pertinent for in the Urgent  Care Center his orthostatics were lying he was 149/85 and a pulse of 46,  sitting his blood pressure was 145/86 with a pulse of 48 and standing his  blood pressure was 154/90 with a heart rate of 54.  In the emergency room  his temperature was 97.4, the blood pressure 146/92, pulse of 62,  respiratory rate of 20, and he is 100% on room air.  GENERAL:  He is well developed, well nourished, in no acute distress.  HEENT:  He is normocephalic, atraumatic with pupils equal, round, and  reactive to light.  His extraocular muscles are intact.  He does however,  have positive nystagmus on left gaze.  Mucous membranes are moist.  His TMs  are intact, he does not appear to have any serous bullae and I am unable to  visualize the ossicles clearly.  His fundi are intact with no AV nicking and  no abnormalities of disc.  NECK:  He has no carotid bruits.  PULMONARY:  His chest is clear to auscultation.  There is no rhonchi, rales,  or  wheezes.  CARDIOVASCULAR:  He is bradycardic.  Positive S1, S1, no S3 or S4.  No  murmurs, rubs, or gallops.  ABDOMEN:  His abdomen is soft, nontender, nondistended, with positive bowel  sounds.  He has no hepatosplenomegaly.  EXTREMITIES:  No clubbing, no cyanosis, and no edema.  NEUROLOGICAL:  With a left gaze, he does have beating nystagmus.  He does  not appear to have any field defects.  He does have truncal ataxia, he has a  wide-based gait, and he is falling to the right.  He does have a positive  Romberg.  He has no other cranial nerve deficits.  His power is 5/5, deep  tendon reflexes are 2+ and station is grossly intact, plantars are  downgoing, vibratory sense is in place.   LABORATORIES:  White count of 9.4, hemoglobin of 15.6, hematocrit of 45.6  and platelets of 204.  His sodium is 142, his potassium is 4.4, chloride is  109 with a CO2 of 25, his BUN is 9, his creatinine is 1, and his glucose is  103.  His TSH is pending.  His LFTs are within normal limits.   ASSESSMENT AND PLAN:  1. This is a 55 year old white male with no significant past medical history     for stroke or heart disease other than elevated cholesterol and he     presents with a persistent dizziness with associated vomiting and ataxia.     His exam is pertinent for nystagmus, ataxia, and positive Romberg.  We     will be transferring this patient to [facility] for further workup.     We will consult the stroke service.  2. Cardiovascular.  He will be on telemetry monitoring for bradycardia and     we will consider a cardiology consult should his bradycardia become     symptomatic.  3. Neurological.  We will check an MRI/MRA of his head and neck, check     carotid Dopplers and an echo.  We will  draw a fasting lipid panel, a TSH,     a homocysteine.  4. Gastrointestinal.  We will start Protonix 40 mg once daily since he will     be lying down with his hiatal hernia history and this may prevent some     reflux.  5. Genitourinary.  There are no current issues.  6. Endocrine.  We will check a hemoglobin and a TSH.  7. We will continue his colchicine and pain medications for his knee.  8. Further recommendations will be pending his examination by Dr. Pearlean Brownie.                                               [name], MD   MLT/MEDQ  D:  12/30/2003  T:  12/30/2003  Job:  [id number]   cc:   Dr. Pearlean Brownie   [name]

## 2011-01-10 NOTE — Assessment & Plan Note (Signed)
[facility]                              CARDIOLOGY OFFICE NOTE   NAME:Sexson, Ashton                       MRN:          [id number]  DATE:06/19/2006                            DOB:          1955/12/14    TREADMILL TEST:  Duration of exercise 9-1/2 minutes.  Maximum heart rate  169.  There was no chest pain, so Mr. Scheibel was able to exercise 1/2  minute of stage 4 of the Bruce protocol with a heart rate of 169 at which  time the test was terminated due to fatigue.  There was no chest pain.  The  resting electrocardiogram was normal.  There were no arrhythmias during  exercise.  Post exercise electrocardiogram showed 1.5 mm ST depression in  the lateral precordial lead with about 1 mm of flat depression that  persisted for about 2 minutes.  This was interpreted as a positive exercise  test but with very good exercise tolerance and no chest pain.   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  He saw Dr. 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  Because of markedly positive risk factors she  recommended that he come in for evaluation with a stress test.   PAST MEDICAL HISTORY:  Is significant for hypertension and hyperlipidemia.  He also has a history of gout and ankylosing spondylitis.  Also  osteoarthritis of  the knees.   CURRENT MEDICATIONS:  Colchicine, Mobic, trazodone, Lipitor, Percocet,  Lisinopril.   FAMILY HISTORY:  Positive in that his mother died of a heart attack at age  80 and his maternal grandmother died of a myocardial infarction.   PHYSICAL EXAMINATION:  VITAL SIGNS:  Blood pressure was 153/82 and the pulse  80 and regular.  NECK:  There was venous distention.  The carotid pulses were full without  bruits.  CHEST:  Was clear.  HEART:  Regular was rate.  Heart sounds were  normal with no murmurs or  gallops.  ABDOMEN:  Soft, normal bowel sounds.  PULSES: Peripheral pulses were full with no peripheral edema.   We performed a stress test and he completed 1/2 minute of stage 4 of the  Bruce protocol without chest pain but he did have 1 to 1.5  mm of lateral ST  depression and it was interpreted as a positive test.   IMPRESSION:  1. Chest pain and positive stress ECG suggestive of myocardial ischemia.  2. Hypertension, treated.  3. Hyperlipidemia, treated.  4. History of gout.   RECOMMENDATIONS:  Mr. Trefry is having some sense of chest pain which is  sometimes exertional but sometimes not.  His stress test is borderline  abnormal with a very good exercise tolerance and no chest pain but 1 to 1.5  mm of lateral ST depression.  This is suggestive that he may have ischemia.  I have recommended evaluation with coronary angiography and we will plan to  arrange for this in  the next couple of weeks.  I will be out from work the  next few weeks and I will arrange to schedule this with Dr. Excell Seltzer.    ______________________________  Everardo Beals Juanda Chance, MD, [facility]    BRB/MedQ  DD: 06/19/2006  DT: 06/22/2006  Job #: [id number]   cc:   Miki Kins, MD

## 2011-01-10 NOTE — Op Note (Signed)
   [name], [name]                          ACCOUNT NO.:  [id number]   MEDICAL RECORD NO.:  [id number]                   PATIENT TYPE:  AMB   LOCATION:  ENDO                                 FACILITY:  [facility]   PHYSICIAN:  Danise Edge, M.D.                DATE OF BIRTH:  10-10-1955   DATE OF PROCEDURE:  07/07/2002  DATE OF DISCHARGE:                                 OPERATIVE REPORT   PROCEDURE PERFORMED:  Colonoscopy and polypectomy.   REFERRING PHYSICIAN:  Dr. Burnell Blanks   ENDOSCOPIST:  Charolett Bumpers, M.D.   INDICATIONS FOR PROCEDURE:  The patient is a 55 year old male born Jan 19, 1956.  The patient's mother underwent surgery to remove a segment of her  colon involved with a colon polyp which he does not think was cancerous.  The patient is referred for his first screening colonoscopy with polypectomy  to prevent colon cancer.   PREMEDICATION:  Fentanyl 75 mcg, Versed 10 mg.   INSTRUMENT USED:  Pediatric Olympus video colonoscope.   DESCRIPTION OF PROCEDURE:  After obtaining informed consent, the patient was  placed in the left lateral decubitus position.  I administered intravenous  fentanyl and intravenous Versed to achieve conscious sedation for the  procedure.  The patient's blood pressure, oxygen saturations and cardiac  rhythm were monitored throughout the procedure and documented in the medical  record.   Anal inspection was normal.  Digital rectal exam was normal.  The pediatric  Olympus video colonoscope was introduced into the rectum and advanced to the  cecum.  Colonic preparation for the exam today was excellent.   Rectum:  Normal.   Sigmoid colon and descending colon:  Left colonic diverticulosis.  At 35 cm  from the anal verge, a 3 mm pedunculated polyp was removed with  electrocautery snare and submitted for pathological interpretation.   Splenic flexure:  Normal.   Transverse colon:  Normal.   Hepatic flexure:  Normal.   Ascending  colon:  Normal.   Ileocecal valve and cecum:  Normal.   ASSESSMENT:  1. Left colonic diverticulosis.  2.     From the distal sigmoid colon at 35 cm from the anal verge, a 3 mm     pedunculated polyp was removed with electrocautery snare and submitted     for pathologic interpretation.   RECOMMENDATIONS:  Repeat colonoscopy in five years.                                                Danise Edge, M.D.    MJ/MEDQ  D:  07/07/2002  T:  07/07/2002  Job:  [id number]

## 2011-01-10 NOTE — Op Note (Signed)
[place]  Patient:    [name] [name], [name] [name]                       MRN: [id number] Proc. Date: 01/26/01 Adm. Date:  [id number] Attending:  Marcene Corning                           Operative Report  PREOPERATIVE DIAGNOSIS:  Right knee chondromalacia of the patella.  POSTOPERATIVE DIAGNOSIS:  Right knee degenerative joint disease.  PROCEDURES: 1. Right knee chondroplasty, all three compartments. 2. Right knee lateral release.  ANESTHESIA:  General.  SURGEON:  Lubertha Basque. Jerl Santos, M.D.  ASSISTANT:  Prince Rome, P.A.  INDICATION FOR PROCEDURE:  The patient is a 55 year old male with a long history of right knee pain.  This has persisted despite oral anti-inflammatories and multiple injections.  At this point, he is offered operative intervention to consist of an arthroscopy.  The procedure was discussed with the patient, and informed operative consent was obtained after discussion of the possible complications of, reaction to the anesthesia, and infection.  DESCRIPTION OF PROCEDURE:  The patient was taken to the operating suite, where a general anesthetic was applied without difficulty.  He was positioned supine and prepped and draped in a normal sterile fashion.  After administration of preoperative IV antibiotics, an arthroscopy of the right knee was performed through a total of three portals.  Medial compartment exhibited some grade 3 chondromalacia.  It appeared that he had completely bare bone in the compartment but upon probing, it seemed that some of this cartilage was intact though not of normal consistency or color.  He had evidence of cortisone placement in the knee.  A thorough chondroplasty was performed.  The medial meniscus itself was intact.  The lateral compartment, which appeared quite similar with an altered appearance to his articular cartilage and an altered feel to it as well on probing.  However, there did seem to be  some sort of cartilage covering on the bone and no real grade 4 change.  The patellofemoral joint was also similar with what initially appeared to be complete loss of cartilage on both aspects but upon probing, it seemed as though some sort of tissue remained present, though again it did not appear or feel normal.  A chondroplasty was performed of rough surfaces, followed by a lateral release through the additional portal.  Once the lateral release had been accomplished, the patella tracked in a better position.  The knee was thoroughly irrigated at the end of the case, followed by the placement of Marcaine with epinephrine and morphine.  Adaptic was placed over the portals, followed by dry gauze and a loose Ace wrap.  Estimated blood loss and intraoperative fluids can be obtained from anesthesia records.  DISPOSITION:  The patient was extubated in the operating room and taken to the recovery room in stable condition.  Plans were for him to go home the same day and follow up in the office in less than a week.  I will contact him by phone tonight. DD:  01/26/01 TD:  01/26/01 Job: [id number]

## 2011-01-10 NOTE — Cardiovascular Report (Signed)
[name] [name], [name] [name]              ACCOUNT NO.:  [id number]   MEDICAL RECORD NO.:  [id number]          PATIENT TYPE:  OIB   LOCATION:  NA                           FACILITY:  [facility]   PHYSICIAN:  Veverly Fells. Excell Seltzer, MD  DATE OF BIRTH:  1955-09-29   DATE OF PROCEDURE:  DATE OF DISCHARGE:                              CARDIAC CATHETERIZATION   PROCEDURES:  1. Left heart catheterization.  2. Selective coronary angiography.  3. Left ventricular angiography.   INDICATION:  [name] [name] is a 56 year old gentleman with multiple  cardiovascular risk factors including hypertension, dyslipidemia and family  history of coronary artery disease who underwent an exercise treadmill study  where he demonstrated good exercise tolerance, but had significant ST  depression that was persistent approximately two minutes after exercise.  He  was subsequently referred for a cardiac catheterization in the setting of  his risk factors and abnormal stress test.   PROCEDURAL DETAILS:  Risks and indications of the procedure were explained  in detail to the patient.  Informed consent was obtained.  The right groin  was prepped, draped and anesthetized with 1% lidocaine.  Using the modified  Seldinger technique, a 4-French arterial sheath was inserted into the right  femoral artery.  Multiple angiographic views of both the left and right  coronary arteries were taken.  For the left coronary artery a 4-French JL-4  catheter was used, for the right coronary artery a 4-French JR-4 catheter  was used.  Following selective coronary angiography, a 4-Frecnch angled  pigtail catheter was inserted into the left ventricle.  Left ventricular  pressures were recorded.  A 30-degree right anterior oblique left  ventriculogram was performed.  Following left ventriculography, a pullback  across the aortic valve was done.  At the conclusion of the case, the sheath  was removed and manual pressure used for hemostasis.   FINDINGS:  Aortic pressure 102/61 with a mean of 80, left ventricular  pressure 102/2 with an end-diastolic pressure of 8.   Coronary angiography:  The left mainstem is angiographically normal.  It  bifurcates into the LAD and left circumflex.   The LAD courses down the anterior wall and wraps around the left ventricular  apex.  It gives off two proximal septal perforator branches.  There are also  two proximal diagonal branches.  There is no significant angiographic  disease seen throughout the LAD system.   The left circumflex artery is a large caliber vessel.  The left circumflex  courses down the atrial ventricular groove and gives off two obtuse marginal  branches, the first obtuse marginal is medium caliber, the second obtuse  marginal is a large caliber vessel, it then terminates into a left  posterolateral branch.  There is no angiographic disease seen in the left  circumflex system.   The right coronary artery is a medium caliber vessel.  It terminates in a  PDA and posterior AV segment which gives off one posterolateral branch.  There is an RV marginal branch giving off from the mid-portion of the right  coronary artery.  There is no angiographic disease seen in  the right  coronary artery.   Left ventriculography performed in the 30-degree right anterior oblique  projection demonstrates normal left ventricular systolic function with a  left ventricular ejection fraction of 60%.  There is no mitral  regurgitation.   ASSESSMENT:  1. Normal coronary arteries.  2. Normal left ventricular function.   PLAN:  [name] [name] should continue with his cardiovascular risk factor  modification.  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  He likely had a false-positive exercise ECG study.      Veverly Fells. Excell Seltzer, MD  Electronically Signed     MDC/MEDQ  D:  07/08/2006  T:  07/08/2006  Job:  [id number]   cc:   Everardo Beals. Juanda Chance, MD, [facility]  Mora L.  M.D. [place]

## 2011-05-22 LAB — BASIC METABOLIC PANEL
BUN: 12
CO2: 26
Chloride: 104
Creatinine, Ser: 1
Glucose, Bld: 108 — ABNORMAL HIGH

## 2011-05-29 LAB — BASIC METABOLIC PANEL
BUN: 12 mg/dL (ref 6–23)
CO2: 24 mEq/L (ref 19–32)
Chloride: 101 mEq/L (ref 96–112)
Creatinine, Ser: 0.89 mg/dL (ref 0.4–1.5)
Glucose, Bld: 99 mg/dL (ref 70–99)

## 2012-02-11 IMAGING — CR DG CHEST 1V PORT
1 series · 1 of 1 positions shown · non-contrast
Comparison: 07/10/2001 study.

CLINICAL DATA: History of chest pain.  Shortness of breath.

PORTABLE CHEST - 1 VIEW

[view not recorded]
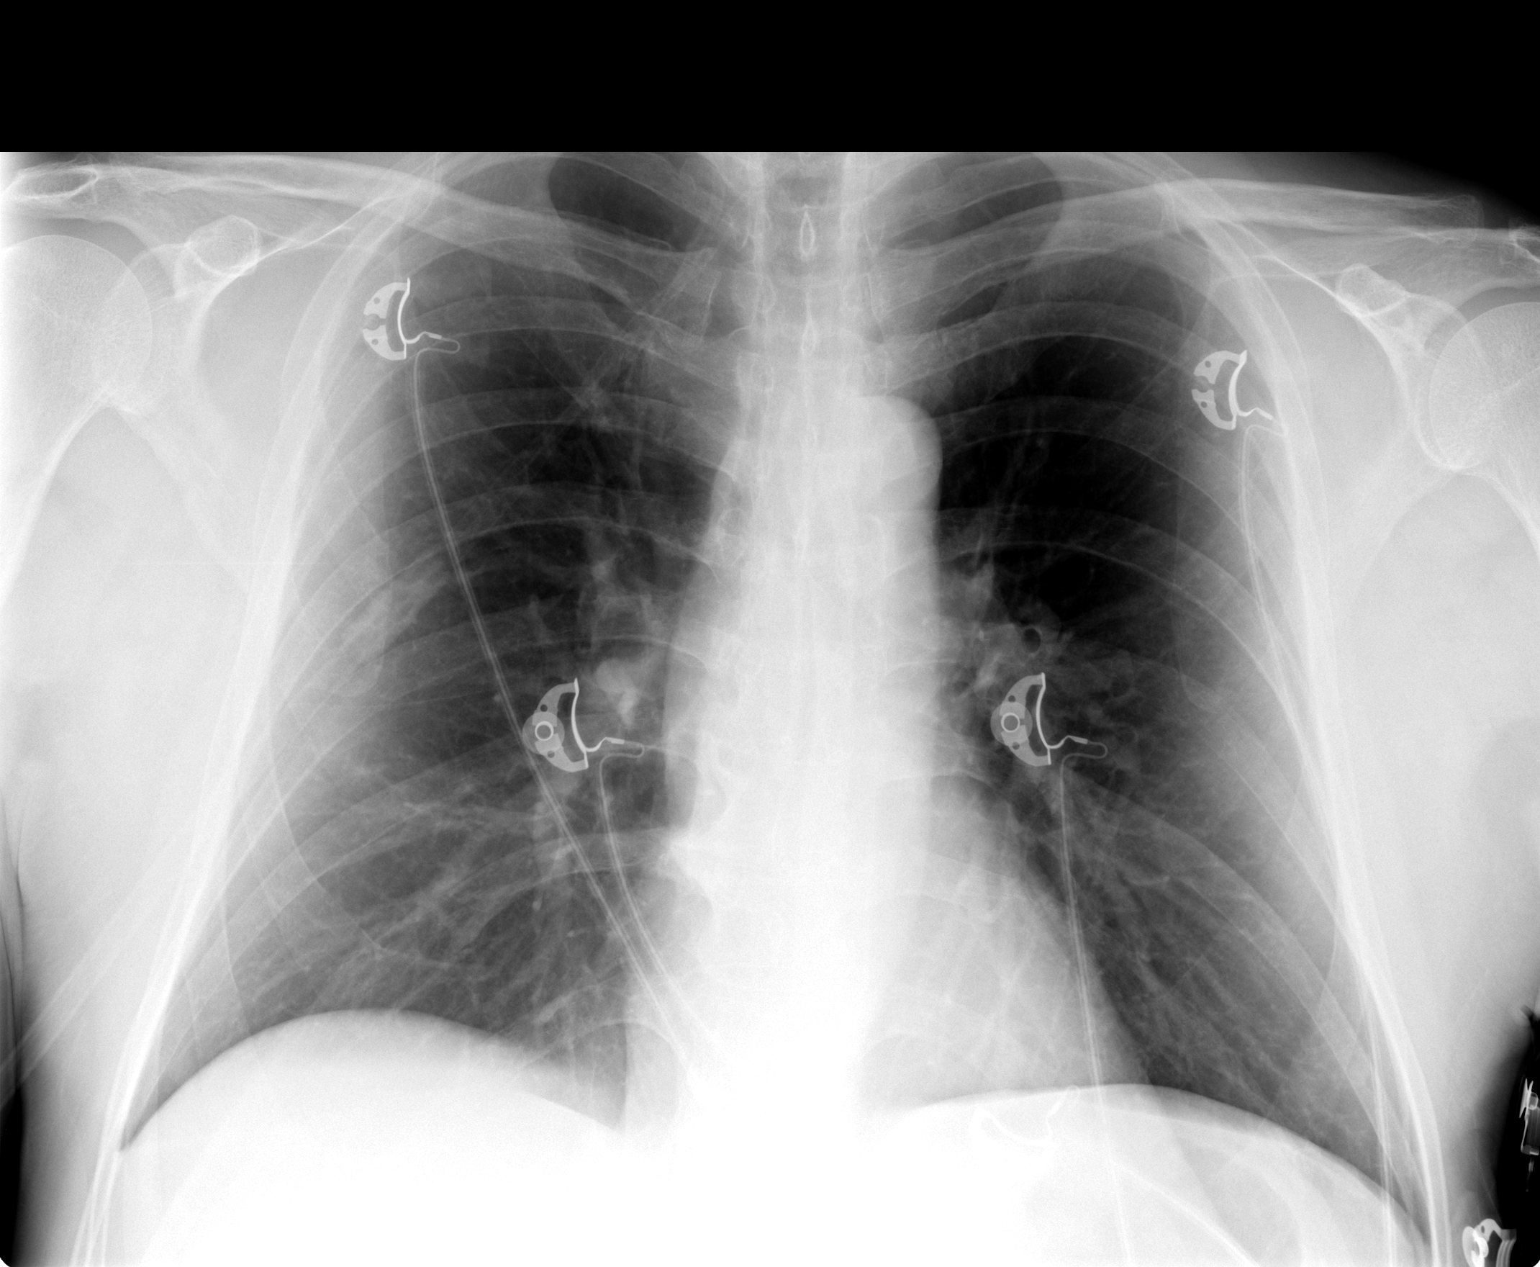

[1 of 1 positions shown; findings below may reference images not displayed]

FINDINGS: The cardiac silhouette is normal size and shape.
Cardiac density is smaller than on previous study.  No pulmonary
edema, pneumonia, pneumothorax, or pleural effusion is seen.  There
is osteophyte formation spine.
IMPRESSION: No acute or active process is identified.

## 2017-09-16 DIAGNOSIS — M545 Low back pain: Secondary | ICD-10-CM | POA: Diagnosis not present

## 2017-09-16 DIAGNOSIS — M79672 Pain in left foot: Secondary | ICD-10-CM | POA: Diagnosis not present

## 2017-09-16 DIAGNOSIS — Z79891 Long term (current) use of opiate analgesic: Secondary | ICD-10-CM | POA: Diagnosis not present

## 2017-09-16 DIAGNOSIS — G894 Chronic pain syndrome: Secondary | ICD-10-CM | POA: Diagnosis not present

## 2017-10-14 DIAGNOSIS — Z79891 Long term (current) use of opiate analgesic: Secondary | ICD-10-CM | POA: Diagnosis not present

## 2017-10-14 DIAGNOSIS — G894 Chronic pain syndrome: Secondary | ICD-10-CM | POA: Diagnosis not present

## 2017-10-14 DIAGNOSIS — M79672 Pain in left foot: Secondary | ICD-10-CM | POA: Diagnosis not present

## 2017-10-14 DIAGNOSIS — M545 Low back pain: Secondary | ICD-10-CM | POA: Diagnosis not present

## 2017-11-11 DIAGNOSIS — M545 Low back pain: Secondary | ICD-10-CM | POA: Diagnosis not present

## 2017-11-11 DIAGNOSIS — M79672 Pain in left foot: Secondary | ICD-10-CM | POA: Diagnosis not present

## 2017-11-11 DIAGNOSIS — Z79891 Long term (current) use of opiate analgesic: Secondary | ICD-10-CM | POA: Diagnosis not present

## 2017-11-11 DIAGNOSIS — G894 Chronic pain syndrome: Secondary | ICD-10-CM | POA: Diagnosis not present

## 2017-12-10 DIAGNOSIS — M545 Low back pain: Secondary | ICD-10-CM | POA: Diagnosis not present

## 2017-12-10 DIAGNOSIS — Z79891 Long term (current) use of opiate analgesic: Secondary | ICD-10-CM | POA: Diagnosis not present

## 2017-12-10 DIAGNOSIS — M79672 Pain in left foot: Secondary | ICD-10-CM | POA: Diagnosis not present

## 2017-12-10 DIAGNOSIS — G894 Chronic pain syndrome: Secondary | ICD-10-CM | POA: Diagnosis not present

## 2018-01-05 DIAGNOSIS — J4520 Mild intermittent asthma, uncomplicated: Secondary | ICD-10-CM | POA: Diagnosis not present

## 2018-01-05 DIAGNOSIS — R001 Bradycardia, unspecified: Secondary | ICD-10-CM | POA: Diagnosis not present

## 2018-01-05 DIAGNOSIS — Z79899 Other long term (current) drug therapy: Secondary | ICD-10-CM | POA: Diagnosis not present

## 2018-01-05 DIAGNOSIS — R079 Chest pain, unspecified: Secondary | ICD-10-CM | POA: Diagnosis not present

## 2018-01-05 DIAGNOSIS — J449 Chronic obstructive pulmonary disease, unspecified: Secondary | ICD-10-CM | POA: Diagnosis not present

## 2018-01-05 DIAGNOSIS — K449 Diaphragmatic hernia without obstruction or gangrene: Secondary | ICD-10-CM | POA: Diagnosis not present

## 2018-01-05 DIAGNOSIS — R918 Other nonspecific abnormal finding of lung field: Secondary | ICD-10-CM | POA: Diagnosis not present

## 2018-01-05 DIAGNOSIS — R911 Solitary pulmonary nodule: Secondary | ICD-10-CM | POA: Diagnosis not present

## 2018-01-05 DIAGNOSIS — I10 Essential (primary) hypertension: Secondary | ICD-10-CM | POA: Diagnosis not present

## 2018-01-05 DIAGNOSIS — R0602 Shortness of breath: Secondary | ICD-10-CM | POA: Diagnosis not present

## 2018-01-05 DIAGNOSIS — I700 Atherosclerosis of aorta: Secondary | ICD-10-CM | POA: Diagnosis not present

## 2018-01-07 DIAGNOSIS — M79672 Pain in left foot: Secondary | ICD-10-CM | POA: Diagnosis not present

## 2018-01-07 DIAGNOSIS — G894 Chronic pain syndrome: Secondary | ICD-10-CM | POA: Diagnosis not present

## 2018-01-07 DIAGNOSIS — M545 Low back pain: Secondary | ICD-10-CM | POA: Diagnosis not present

## 2018-01-07 DIAGNOSIS — Z79891 Long term (current) use of opiate analgesic: Secondary | ICD-10-CM | POA: Diagnosis not present

## 2018-02-01 DIAGNOSIS — R0602 Shortness of breath: Secondary | ICD-10-CM | POA: Diagnosis not present

## 2018-02-01 DIAGNOSIS — R001 Bradycardia, unspecified: Secondary | ICD-10-CM | POA: Diagnosis not present

## 2018-02-01 DIAGNOSIS — I10 Essential (primary) hypertension: Secondary | ICD-10-CM | POA: Diagnosis not present

## 2018-02-01 DIAGNOSIS — J441 Chronic obstructive pulmonary disease with (acute) exacerbation: Secondary | ICD-10-CM | POA: Diagnosis not present

## 2018-02-01 DIAGNOSIS — Z6826 Body mass index (BMI) 26.0-26.9, adult: Secondary | ICD-10-CM | POA: Diagnosis not present

## 2018-02-01 DIAGNOSIS — R911 Solitary pulmonary nodule: Secondary | ICD-10-CM | POA: Diagnosis not present

## 2018-02-05 DIAGNOSIS — Z79891 Long term (current) use of opiate analgesic: Secondary | ICD-10-CM | POA: Diagnosis not present

## 2018-02-05 DIAGNOSIS — M545 Low back pain: Secondary | ICD-10-CM | POA: Diagnosis not present

## 2018-02-05 DIAGNOSIS — M79672 Pain in left foot: Secondary | ICD-10-CM | POA: Diagnosis not present

## 2018-02-05 DIAGNOSIS — G894 Chronic pain syndrome: Secondary | ICD-10-CM | POA: Diagnosis not present

## 2018-03-12 DIAGNOSIS — G894 Chronic pain syndrome: Secondary | ICD-10-CM | POA: Diagnosis not present

## 2018-03-12 DIAGNOSIS — M545 Low back pain: Secondary | ICD-10-CM | POA: Diagnosis not present

## 2018-03-12 DIAGNOSIS — Z79891 Long term (current) use of opiate analgesic: Secondary | ICD-10-CM | POA: Diagnosis not present

## 2018-03-12 DIAGNOSIS — M79672 Pain in left foot: Secondary | ICD-10-CM | POA: Diagnosis not present

## 2018-04-06 DIAGNOSIS — R918 Other nonspecific abnormal finding of lung field: Secondary | ICD-10-CM | POA: Diagnosis not present

## 2018-04-06 DIAGNOSIS — I10 Essential (primary) hypertension: Secondary | ICD-10-CM | POA: Diagnosis not present

## 2018-04-06 DIAGNOSIS — J439 Emphysema, unspecified: Secondary | ICD-10-CM | POA: Diagnosis not present

## 2018-04-06 DIAGNOSIS — E785 Hyperlipidemia, unspecified: Secondary | ICD-10-CM | POA: Diagnosis not present

## 2018-04-09 DIAGNOSIS — M79672 Pain in left foot: Secondary | ICD-10-CM | POA: Diagnosis not present

## 2018-04-09 DIAGNOSIS — Z79891 Long term (current) use of opiate analgesic: Secondary | ICD-10-CM | POA: Diagnosis not present

## 2018-04-09 DIAGNOSIS — G894 Chronic pain syndrome: Secondary | ICD-10-CM | POA: Diagnosis not present

## 2018-04-09 DIAGNOSIS — M545 Low back pain: Secondary | ICD-10-CM | POA: Diagnosis not present

## 2018-05-06 DIAGNOSIS — Z125 Encounter for screening for malignant neoplasm of prostate: Secondary | ICD-10-CM | POA: Diagnosis not present

## 2018-05-06 DIAGNOSIS — Z1211 Encounter for screening for malignant neoplasm of colon: Secondary | ICD-10-CM | POA: Diagnosis not present

## 2018-05-06 DIAGNOSIS — Z131 Encounter for screening for diabetes mellitus: Secondary | ICD-10-CM | POA: Diagnosis not present

## 2018-05-06 DIAGNOSIS — Z23 Encounter for immunization: Secondary | ICD-10-CM | POA: Diagnosis not present

## 2018-05-06 DIAGNOSIS — Z13220 Encounter for screening for lipoid disorders: Secondary | ICD-10-CM | POA: Diagnosis not present

## 2018-05-06 DIAGNOSIS — Z0000 Encounter for general adult medical examination without abnormal findings: Secondary | ICD-10-CM | POA: Diagnosis not present

## 2018-05-10 DIAGNOSIS — G894 Chronic pain syndrome: Secondary | ICD-10-CM | POA: Diagnosis not present

## 2018-05-10 DIAGNOSIS — M79672 Pain in left foot: Secondary | ICD-10-CM | POA: Diagnosis not present

## 2018-05-10 DIAGNOSIS — Z79891 Long term (current) use of opiate analgesic: Secondary | ICD-10-CM | POA: Diagnosis not present

## 2018-05-10 DIAGNOSIS — M545 Low back pain: Secondary | ICD-10-CM | POA: Diagnosis not present

## 2018-06-07 DIAGNOSIS — Z79891 Long term (current) use of opiate analgesic: Secondary | ICD-10-CM | POA: Diagnosis not present

## 2018-06-07 DIAGNOSIS — G894 Chronic pain syndrome: Secondary | ICD-10-CM | POA: Diagnosis not present

## 2018-06-07 DIAGNOSIS — M79672 Pain in left foot: Secondary | ICD-10-CM | POA: Diagnosis not present

## 2018-06-07 DIAGNOSIS — M545 Low back pain: Secondary | ICD-10-CM | POA: Diagnosis not present

## 2018-07-05 DIAGNOSIS — M545 Low back pain: Secondary | ICD-10-CM | POA: Diagnosis not present

## 2018-07-05 DIAGNOSIS — Z79891 Long term (current) use of opiate analgesic: Secondary | ICD-10-CM | POA: Diagnosis not present

## 2018-07-05 DIAGNOSIS — M79672 Pain in left foot: Secondary | ICD-10-CM | POA: Diagnosis not present

## 2018-07-05 DIAGNOSIS — G894 Chronic pain syndrome: Secondary | ICD-10-CM | POA: Diagnosis not present

## 2018-08-02 DIAGNOSIS — M545 Low back pain: Secondary | ICD-10-CM | POA: Diagnosis not present

## 2018-08-02 DIAGNOSIS — Z79891 Long term (current) use of opiate analgesic: Secondary | ICD-10-CM | POA: Diagnosis not present

## 2018-08-02 DIAGNOSIS — M79672 Pain in left foot: Secondary | ICD-10-CM | POA: Diagnosis not present

## 2018-08-02 DIAGNOSIS — G894 Chronic pain syndrome: Secondary | ICD-10-CM | POA: Diagnosis not present

## 2018-08-13 DIAGNOSIS — Z1211 Encounter for screening for malignant neoplasm of colon: Secondary | ICD-10-CM | POA: Diagnosis not present

## 2018-08-13 DIAGNOSIS — Z800 Family history of malignant neoplasm of digestive organs: Secondary | ICD-10-CM | POA: Diagnosis not present

## 2018-08-13 DIAGNOSIS — D125 Benign neoplasm of sigmoid colon: Secondary | ICD-10-CM | POA: Diagnosis not present

## 2018-08-13 DIAGNOSIS — Z86010 Personal history of colonic polyps: Secondary | ICD-10-CM | POA: Diagnosis not present

## 2018-08-13 DIAGNOSIS — K635 Polyp of colon: Secondary | ICD-10-CM | POA: Diagnosis not present

## 2018-09-02 DIAGNOSIS — M79672 Pain in left foot: Secondary | ICD-10-CM | POA: Diagnosis not present

## 2018-09-02 DIAGNOSIS — Z79891 Long term (current) use of opiate analgesic: Secondary | ICD-10-CM | POA: Diagnosis not present

## 2018-09-02 DIAGNOSIS — M545 Low back pain: Secondary | ICD-10-CM | POA: Diagnosis not present

## 2018-09-02 DIAGNOSIS — G894 Chronic pain syndrome: Secondary | ICD-10-CM | POA: Diagnosis not present

## 2018-09-21 DIAGNOSIS — Z7951 Long term (current) use of inhaled steroids: Secondary | ICD-10-CM | POA: Diagnosis not present

## 2018-09-21 DIAGNOSIS — R0602 Shortness of breath: Secondary | ICD-10-CM | POA: Diagnosis not present

## 2018-09-21 DIAGNOSIS — Z6826 Body mass index (BMI) 26.0-26.9, adult: Secondary | ICD-10-CM | POA: Diagnosis not present

## 2018-09-21 DIAGNOSIS — I10 Essential (primary) hypertension: Secondary | ICD-10-CM | POA: Diagnosis not present

## 2018-09-21 DIAGNOSIS — R001 Bradycardia, unspecified: Secondary | ICD-10-CM | POA: Diagnosis not present

## 2018-09-21 DIAGNOSIS — Z791 Long term (current) use of non-steroidal anti-inflammatories (NSAID): Secondary | ICD-10-CM | POA: Diagnosis not present

## 2018-09-21 DIAGNOSIS — M1990 Unspecified osteoarthritis, unspecified site: Secondary | ICD-10-CM | POA: Diagnosis not present

## 2018-09-21 DIAGNOSIS — J45909 Unspecified asthma, uncomplicated: Secondary | ICD-10-CM | POA: Diagnosis not present

## 2018-09-21 DIAGNOSIS — R918 Other nonspecific abnormal finding of lung field: Secondary | ICD-10-CM | POA: Diagnosis not present

## 2018-10-17 DIAGNOSIS — J189 Pneumonia, unspecified organism: Secondary | ICD-10-CM | POA: Diagnosis not present

## 2018-10-17 DIAGNOSIS — J45909 Unspecified asthma, uncomplicated: Secondary | ICD-10-CM | POA: Diagnosis not present

## 2018-10-28 DIAGNOSIS — M545 Low back pain: Secondary | ICD-10-CM | POA: Diagnosis not present

## 2018-10-28 DIAGNOSIS — Z79891 Long term (current) use of opiate analgesic: Secondary | ICD-10-CM | POA: Diagnosis not present

## 2018-10-28 DIAGNOSIS — G894 Chronic pain syndrome: Secondary | ICD-10-CM | POA: Diagnosis not present

## 2018-10-28 DIAGNOSIS — M79672 Pain in left foot: Secondary | ICD-10-CM | POA: Diagnosis not present

## 2018-12-09 DIAGNOSIS — N529 Male erectile dysfunction, unspecified: Secondary | ICD-10-CM | POA: Diagnosis not present

## 2018-12-09 DIAGNOSIS — Z6828 Body mass index (BMI) 28.0-28.9, adult: Secondary | ICD-10-CM | POA: Diagnosis not present

## 2018-12-09 DIAGNOSIS — M109 Gout, unspecified: Secondary | ICD-10-CM | POA: Diagnosis not present

## 2018-12-27 DIAGNOSIS — G894 Chronic pain syndrome: Secondary | ICD-10-CM | POA: Diagnosis not present

## 2018-12-27 DIAGNOSIS — M545 Low back pain: Secondary | ICD-10-CM | POA: Diagnosis not present

## 2018-12-27 DIAGNOSIS — Z79891 Long term (current) use of opiate analgesic: Secondary | ICD-10-CM | POA: Diagnosis not present

## 2018-12-27 DIAGNOSIS — M79672 Pain in left foot: Secondary | ICD-10-CM | POA: Diagnosis not present

## 2019-02-21 DIAGNOSIS — M79672 Pain in left foot: Secondary | ICD-10-CM | POA: Diagnosis not present

## 2019-02-21 DIAGNOSIS — M545 Low back pain: Secondary | ICD-10-CM | POA: Diagnosis not present

## 2019-02-21 DIAGNOSIS — Z79891 Long term (current) use of opiate analgesic: Secondary | ICD-10-CM | POA: Diagnosis not present

## 2019-02-21 DIAGNOSIS — G894 Chronic pain syndrome: Secondary | ICD-10-CM | POA: Diagnosis not present

## 2019-04-12 DIAGNOSIS — Z20828 Contact with and (suspected) exposure to other viral communicable diseases: Secondary | ICD-10-CM | POA: Diagnosis not present

## 2019-09-02 DIAGNOSIS — R918 Other nonspecific abnormal finding of lung field: Secondary | ICD-10-CM | POA: Diagnosis not present

## 2019-09-02 DIAGNOSIS — R1013 Epigastric pain: Secondary | ICD-10-CM | POA: Diagnosis not present

## 2019-09-02 DIAGNOSIS — M6208 Separation of muscle (nontraumatic), other site: Secondary | ICD-10-CM | POA: Diagnosis not present

## 2019-09-02 DIAGNOSIS — K449 Diaphragmatic hernia without obstruction or gangrene: Secondary | ICD-10-CM | POA: Diagnosis not present

## 2019-09-07 DIAGNOSIS — I700 Atherosclerosis of aorta: Secondary | ICD-10-CM | POA: Diagnosis not present

## 2019-09-07 DIAGNOSIS — K5730 Diverticulosis of large intestine without perforation or abscess without bleeding: Secondary | ICD-10-CM | POA: Diagnosis not present

## 2019-09-07 DIAGNOSIS — J984 Other disorders of lung: Secondary | ICD-10-CM | POA: Diagnosis not present

## 2019-09-07 DIAGNOSIS — R918 Other nonspecific abnormal finding of lung field: Secondary | ICD-10-CM | POA: Diagnosis not present

## 2019-09-07 DIAGNOSIS — R109 Unspecified abdominal pain: Secondary | ICD-10-CM | POA: Diagnosis not present

## 2019-09-07 DIAGNOSIS — R05 Cough: Secondary | ICD-10-CM | POA: Diagnosis not present

## 2019-09-07 DIAGNOSIS — R140 Abdominal distension (gaseous): Secondary | ICD-10-CM | POA: Diagnosis not present

## 2019-09-07 DIAGNOSIS — R911 Solitary pulmonary nodule: Secondary | ICD-10-CM | POA: Diagnosis not present

## 2019-09-07 DIAGNOSIS — K760 Fatty (change of) liver, not elsewhere classified: Secondary | ICD-10-CM | POA: Diagnosis not present

## 2019-10-17 DIAGNOSIS — M79672 Pain in left foot: Secondary | ICD-10-CM | POA: Diagnosis not present

## 2019-10-17 DIAGNOSIS — Z79891 Long term (current) use of opiate analgesic: Secondary | ICD-10-CM | POA: Diagnosis not present

## 2019-10-17 DIAGNOSIS — G894 Chronic pain syndrome: Secondary | ICD-10-CM | POA: Diagnosis not present

## 2019-10-17 DIAGNOSIS — M545 Low back pain: Secondary | ICD-10-CM | POA: Diagnosis not present

## 2019-11-10 DIAGNOSIS — J439 Emphysema, unspecified: Secondary | ICD-10-CM | POA: Diagnosis not present

## 2019-11-10 DIAGNOSIS — Z6827 Body mass index (BMI) 27.0-27.9, adult: Secondary | ICD-10-CM | POA: Diagnosis not present

## 2019-11-10 DIAGNOSIS — E663 Overweight: Secondary | ICD-10-CM | POA: Diagnosis not present

## 2019-11-10 DIAGNOSIS — R918 Other nonspecific abnormal finding of lung field: Secondary | ICD-10-CM | POA: Diagnosis not present

## 2019-12-14 DIAGNOSIS — G894 Chronic pain syndrome: Secondary | ICD-10-CM | POA: Diagnosis not present

## 2019-12-14 DIAGNOSIS — M79672 Pain in left foot: Secondary | ICD-10-CM | POA: Diagnosis not present

## 2019-12-14 DIAGNOSIS — M545 Low back pain: Secondary | ICD-10-CM | POA: Diagnosis not present

## 2019-12-14 DIAGNOSIS — Z79891 Long term (current) use of opiate analgesic: Secondary | ICD-10-CM | POA: Diagnosis not present

## 2019-12-27 DIAGNOSIS — I10 Essential (primary) hypertension: Secondary | ICD-10-CM | POA: Diagnosis not present

## 2019-12-27 DIAGNOSIS — G8929 Other chronic pain: Secondary | ICD-10-CM | POA: Diagnosis not present

## 2019-12-27 DIAGNOSIS — M109 Gout, unspecified: Secondary | ICD-10-CM | POA: Diagnosis not present

## 2019-12-27 DIAGNOSIS — M1990 Unspecified osteoarthritis, unspecified site: Secondary | ICD-10-CM | POA: Diagnosis not present

## 2019-12-27 DIAGNOSIS — J439 Emphysema, unspecified: Secondary | ICD-10-CM | POA: Diagnosis not present

## 2020-01-31 DIAGNOSIS — G894 Chronic pain syndrome: Secondary | ICD-10-CM | POA: Diagnosis not present

## 2020-01-31 DIAGNOSIS — Z1389 Encounter for screening for other disorder: Secondary | ICD-10-CM | POA: Diagnosis not present

## 2020-01-31 DIAGNOSIS — M47816 Spondylosis without myelopathy or radiculopathy, lumbar region: Secondary | ICD-10-CM | POA: Diagnosis not present

## 2020-02-10 DIAGNOSIS — G894 Chronic pain syndrome: Secondary | ICD-10-CM | POA: Diagnosis not present

## 2020-02-10 DIAGNOSIS — M47816 Spondylosis without myelopathy or radiculopathy, lumbar region: Secondary | ICD-10-CM | POA: Diagnosis not present

## 2020-02-28 DIAGNOSIS — I700 Atherosclerosis of aorta: Secondary | ICD-10-CM | POA: Diagnosis not present

## 2020-02-28 DIAGNOSIS — M47817 Spondylosis without myelopathy or radiculopathy, lumbosacral region: Secondary | ICD-10-CM | POA: Diagnosis not present

## 2020-02-28 DIAGNOSIS — M48061 Spinal stenosis, lumbar region without neurogenic claudication: Secondary | ICD-10-CM | POA: Diagnosis not present

## 2020-02-28 DIAGNOSIS — G8929 Other chronic pain: Secondary | ICD-10-CM | POA: Diagnosis not present

## 2020-03-06 DIAGNOSIS — Z1389 Encounter for screening for other disorder: Secondary | ICD-10-CM | POA: Diagnosis not present

## 2020-03-06 DIAGNOSIS — G894 Chronic pain syndrome: Secondary | ICD-10-CM | POA: Diagnosis not present

## 2020-03-06 DIAGNOSIS — M5136 Other intervertebral disc degeneration, lumbar region: Secondary | ICD-10-CM | POA: Diagnosis not present

## 2020-03-08 DIAGNOSIS — G8929 Other chronic pain: Secondary | ICD-10-CM | POA: Diagnosis not present

## 2020-03-08 DIAGNOSIS — R918 Other nonspecific abnormal finding of lung field: Secondary | ICD-10-CM | POA: Diagnosis not present

## 2020-03-08 DIAGNOSIS — M1990 Unspecified osteoarthritis, unspecified site: Secondary | ICD-10-CM | POA: Diagnosis not present

## 2020-03-08 DIAGNOSIS — I10 Essential (primary) hypertension: Secondary | ICD-10-CM | POA: Diagnosis not present

## 2020-03-08 DIAGNOSIS — J439 Emphysema, unspecified: Secondary | ICD-10-CM | POA: Diagnosis not present

## 2020-04-03 DIAGNOSIS — G894 Chronic pain syndrome: Secondary | ICD-10-CM | POA: Diagnosis not present

## 2020-04-03 DIAGNOSIS — M5136 Other intervertebral disc degeneration, lumbar region: Secondary | ICD-10-CM | POA: Diagnosis not present

## 2020-05-25 DIAGNOSIS — M545 Low back pain, unspecified: Secondary | ICD-10-CM | POA: Diagnosis not present

## 2020-05-25 DIAGNOSIS — M5136 Other intervertebral disc degeneration, lumbar region: Secondary | ICD-10-CM | POA: Diagnosis not present

## 2020-06-07 DIAGNOSIS — M47816 Spondylosis without myelopathy or radiculopathy, lumbar region: Secondary | ICD-10-CM | POA: Diagnosis not present

## 2020-06-07 DIAGNOSIS — G894 Chronic pain syndrome: Secondary | ICD-10-CM | POA: Diagnosis not present

## 2020-06-07 DIAGNOSIS — M5136 Other intervertebral disc degeneration, lumbar region: Secondary | ICD-10-CM | POA: Diagnosis not present

## 2020-06-18 DIAGNOSIS — J329 Chronic sinusitis, unspecified: Secondary | ICD-10-CM

## 2020-06-18 DIAGNOSIS — J3489 Other specified disorders of nose and nasal sinuses: Secondary | ICD-10-CM

## 2020-06-18 HISTORY — DX: Chronic sinusitis, unspecified: J32.9

## 2020-06-18 HISTORY — DX: Other specified disorders of nose and nasal sinuses: J34.89

## 2020-07-05 DIAGNOSIS — M47816 Spondylosis without myelopathy or radiculopathy, lumbar region: Secondary | ICD-10-CM | POA: Diagnosis not present

## 2020-07-05 DIAGNOSIS — Z79891 Long term (current) use of opiate analgesic: Secondary | ICD-10-CM | POA: Diagnosis not present

## 2020-07-05 DIAGNOSIS — G894 Chronic pain syndrome: Secondary | ICD-10-CM | POA: Diagnosis not present

## 2020-07-05 DIAGNOSIS — M5136 Other intervertebral disc degeneration, lumbar region: Secondary | ICD-10-CM | POA: Diagnosis not present

## 2020-08-02 DIAGNOSIS — M5136 Other intervertebral disc degeneration, lumbar region: Secondary | ICD-10-CM | POA: Diagnosis not present

## 2020-08-02 DIAGNOSIS — M47816 Spondylosis without myelopathy or radiculopathy, lumbar region: Secondary | ICD-10-CM | POA: Diagnosis not present

## 2020-08-02 DIAGNOSIS — G894 Chronic pain syndrome: Secondary | ICD-10-CM | POA: Diagnosis not present

## 2020-08-03 DIAGNOSIS — J0141 Acute recurrent pansinusitis: Secondary | ICD-10-CM | POA: Diagnosis not present

## 2020-08-03 DIAGNOSIS — Z23 Encounter for immunization: Secondary | ICD-10-CM | POA: Diagnosis not present

## 2020-08-03 DIAGNOSIS — Z0000 Encounter for general adult medical examination without abnormal findings: Secondary | ICD-10-CM | POA: Diagnosis not present

## 2020-08-03 DIAGNOSIS — B356 Tinea cruris: Secondary | ICD-10-CM | POA: Diagnosis not present

## 2020-08-03 DIAGNOSIS — B351 Tinea unguium: Secondary | ICD-10-CM | POA: Diagnosis not present

## 2020-09-03 DIAGNOSIS — M47816 Spondylosis without myelopathy or radiculopathy, lumbar region: Secondary | ICD-10-CM | POA: Diagnosis not present

## 2020-09-03 DIAGNOSIS — G894 Chronic pain syndrome: Secondary | ICD-10-CM | POA: Diagnosis not present

## 2020-09-03 DIAGNOSIS — M5136 Other intervertebral disc degeneration, lumbar region: Secondary | ICD-10-CM | POA: Diagnosis not present

## 2020-09-23 DIAGNOSIS — I2699 Other pulmonary embolism without acute cor pulmonale: Secondary | ICD-10-CM | POA: Diagnosis not present

## 2020-09-23 DIAGNOSIS — I10 Essential (primary) hypertension: Secondary | ICD-10-CM | POA: Diagnosis not present

## 2020-09-23 DIAGNOSIS — E785 Hyperlipidemia, unspecified: Secondary | ICD-10-CM | POA: Diagnosis not present

## 2020-09-28 DIAGNOSIS — G894 Chronic pain syndrome: Secondary | ICD-10-CM | POA: Diagnosis not present

## 2020-09-28 DIAGNOSIS — M47816 Spondylosis without myelopathy or radiculopathy, lumbar region: Secondary | ICD-10-CM | POA: Diagnosis not present

## 2020-09-28 DIAGNOSIS — M5136 Other intervertebral disc degeneration, lumbar region: Secondary | ICD-10-CM | POA: Diagnosis not present

## 2020-10-26 DIAGNOSIS — M47816 Spondylosis without myelopathy or radiculopathy, lumbar region: Secondary | ICD-10-CM | POA: Diagnosis not present

## 2020-10-26 DIAGNOSIS — M5136 Other intervertebral disc degeneration, lumbar region: Secondary | ICD-10-CM | POA: Diagnosis not present

## 2020-10-26 DIAGNOSIS — G894 Chronic pain syndrome: Secondary | ICD-10-CM | POA: Diagnosis not present

## 2020-11-02 DIAGNOSIS — J0141 Acute recurrent pansinusitis: Secondary | ICD-10-CM | POA: Diagnosis not present

## 2020-11-02 DIAGNOSIS — I2699 Other pulmonary embolism without acute cor pulmonale: Secondary | ICD-10-CM | POA: Diagnosis not present

## 2020-11-02 DIAGNOSIS — I700 Atherosclerosis of aorta: Secondary | ICD-10-CM | POA: Diagnosis not present

## 2020-11-02 DIAGNOSIS — J418 Mixed simple and mucopurulent chronic bronchitis: Secondary | ICD-10-CM | POA: Diagnosis not present

## 2020-11-02 DIAGNOSIS — G720 Drug-induced myopathy: Secondary | ICD-10-CM | POA: Diagnosis not present

## 2020-11-05 DIAGNOSIS — R059 Cough, unspecified: Secondary | ICD-10-CM | POA: Diagnosis not present

## 2020-11-05 DIAGNOSIS — I2699 Other pulmonary embolism without acute cor pulmonale: Secondary | ICD-10-CM | POA: Diagnosis not present

## 2020-11-05 DIAGNOSIS — J0141 Acute recurrent pansinusitis: Secondary | ICD-10-CM | POA: Diagnosis not present

## 2020-11-05 DIAGNOSIS — Z79899 Other long term (current) drug therapy: Secondary | ICD-10-CM | POA: Diagnosis not present

## 2020-11-05 DIAGNOSIS — R634 Abnormal weight loss: Secondary | ICD-10-CM | POA: Diagnosis not present

## 2020-11-05 DIAGNOSIS — R55 Syncope and collapse: Secondary | ICD-10-CM | POA: Diagnosis not present

## 2020-11-05 DIAGNOSIS — R918 Other nonspecific abnormal finding of lung field: Secondary | ICD-10-CM | POA: Diagnosis not present

## 2020-11-05 DIAGNOSIS — J418 Mixed simple and mucopurulent chronic bronchitis: Secondary | ICD-10-CM | POA: Diagnosis not present

## 2020-11-12 DIAGNOSIS — I2699 Other pulmonary embolism without acute cor pulmonale: Secondary | ICD-10-CM | POA: Diagnosis not present

## 2020-11-12 DIAGNOSIS — J324 Chronic pansinusitis: Secondary | ICD-10-CM | POA: Diagnosis not present

## 2020-11-12 DIAGNOSIS — J418 Mixed simple and mucopurulent chronic bronchitis: Secondary | ICD-10-CM | POA: Diagnosis not present

## 2020-11-12 DIAGNOSIS — R918 Other nonspecific abnormal finding of lung field: Secondary | ICD-10-CM | POA: Diagnosis not present

## 2020-11-12 DIAGNOSIS — R634 Abnormal weight loss: Secondary | ICD-10-CM | POA: Diagnosis not present

## 2020-11-12 DIAGNOSIS — R7989 Other specified abnormal findings of blood chemistry: Secondary | ICD-10-CM | POA: Diagnosis not present

## 2020-11-15 DIAGNOSIS — J479 Bronchiectasis, uncomplicated: Secondary | ICD-10-CM | POA: Diagnosis not present

## 2020-11-15 DIAGNOSIS — J320 Chronic maxillary sinusitis: Secondary | ICD-10-CM | POA: Diagnosis not present

## 2020-11-15 DIAGNOSIS — J324 Chronic pansinusitis: Secondary | ICD-10-CM | POA: Diagnosis not present

## 2020-11-15 DIAGNOSIS — J0110 Acute frontal sinusitis, unspecified: Secondary | ICD-10-CM | POA: Diagnosis not present

## 2020-11-15 DIAGNOSIS — J3489 Other specified disorders of nose and nasal sinuses: Secondary | ICD-10-CM | POA: Diagnosis not present

## 2020-11-15 DIAGNOSIS — I2510 Atherosclerotic heart disease of native coronary artery without angina pectoris: Secondary | ICD-10-CM | POA: Diagnosis not present

## 2020-11-15 DIAGNOSIS — R634 Abnormal weight loss: Secondary | ICD-10-CM | POA: Diagnosis not present

## 2020-11-15 DIAGNOSIS — R918 Other nonspecific abnormal finding of lung field: Secondary | ICD-10-CM | POA: Diagnosis not present

## 2020-11-15 DIAGNOSIS — I700 Atherosclerosis of aorta: Secondary | ICD-10-CM | POA: Diagnosis not present

## 2020-11-30 DIAGNOSIS — G894 Chronic pain syndrome: Secondary | ICD-10-CM | POA: Diagnosis not present

## 2020-11-30 DIAGNOSIS — M47816 Spondylosis without myelopathy or radiculopathy, lumbar region: Secondary | ICD-10-CM | POA: Diagnosis not present

## 2020-11-30 DIAGNOSIS — M5136 Other intervertebral disc degeneration, lumbar region: Secondary | ICD-10-CM | POA: Diagnosis not present

## 2020-12-14 DIAGNOSIS — J324 Chronic pansinusitis: Secondary | ICD-10-CM | POA: Diagnosis not present

## 2020-12-14 DIAGNOSIS — K047 Periapical abscess without sinus: Secondary | ICD-10-CM | POA: Diagnosis not present

## 2020-12-14 DIAGNOSIS — J418 Mixed simple and mucopurulent chronic bronchitis: Secondary | ICD-10-CM | POA: Diagnosis not present

## 2020-12-25 DIAGNOSIS — M47816 Spondylosis without myelopathy or radiculopathy, lumbar region: Secondary | ICD-10-CM | POA: Diagnosis not present

## 2020-12-25 DIAGNOSIS — Z79891 Long term (current) use of opiate analgesic: Secondary | ICD-10-CM | POA: Diagnosis not present

## 2020-12-25 DIAGNOSIS — M5136 Other intervertebral disc degeneration, lumbar region: Secondary | ICD-10-CM | POA: Diagnosis not present

## 2020-12-25 DIAGNOSIS — G894 Chronic pain syndrome: Secondary | ICD-10-CM | POA: Diagnosis not present

## 2021-01-28 DIAGNOSIS — G894 Chronic pain syndrome: Secondary | ICD-10-CM | POA: Diagnosis not present

## 2021-01-28 DIAGNOSIS — M47816 Spondylosis without myelopathy or radiculopathy, lumbar region: Secondary | ICD-10-CM | POA: Diagnosis not present

## 2021-02-02 DIAGNOSIS — G8929 Other chronic pain: Secondary | ICD-10-CM | POA: Diagnosis not present

## 2021-02-02 DIAGNOSIS — Z79899 Other long term (current) drug therapy: Secondary | ICD-10-CM | POA: Diagnosis not present

## 2021-02-02 DIAGNOSIS — R42 Dizziness and giddiness: Secondary | ICD-10-CM | POA: Diagnosis not present

## 2021-02-02 DIAGNOSIS — R112 Nausea with vomiting, unspecified: Secondary | ICD-10-CM | POA: Diagnosis not present

## 2021-02-02 DIAGNOSIS — Z7951 Long term (current) use of inhaled steroids: Secondary | ICD-10-CM | POA: Diagnosis not present

## 2021-02-02 DIAGNOSIS — M545 Low back pain, unspecified: Secondary | ICD-10-CM | POA: Diagnosis not present

## 2021-02-02 DIAGNOSIS — I10 Essential (primary) hypertension: Secondary | ICD-10-CM | POA: Diagnosis not present

## 2021-02-02 DIAGNOSIS — Z79891 Long term (current) use of opiate analgesic: Secondary | ICD-10-CM | POA: Diagnosis not present

## 2021-02-02 DIAGNOSIS — H8309 Labyrinthitis, unspecified ear: Secondary | ICD-10-CM | POA: Diagnosis not present

## 2021-02-26 DIAGNOSIS — M47816 Spondylosis without myelopathy or radiculopathy, lumbar region: Secondary | ICD-10-CM | POA: Diagnosis not present

## 2021-02-26 DIAGNOSIS — G894 Chronic pain syndrome: Secondary | ICD-10-CM | POA: Diagnosis not present

## 2021-02-26 DIAGNOSIS — M5136 Other intervertebral disc degeneration, lumbar region: Secondary | ICD-10-CM | POA: Diagnosis not present

## 2021-03-26 DIAGNOSIS — M5136 Other intervertebral disc degeneration, lumbar region: Secondary | ICD-10-CM | POA: Diagnosis not present

## 2021-03-26 DIAGNOSIS — M47816 Spondylosis without myelopathy or radiculopathy, lumbar region: Secondary | ICD-10-CM | POA: Diagnosis not present

## 2021-03-26 DIAGNOSIS — G894 Chronic pain syndrome: Secondary | ICD-10-CM | POA: Diagnosis not present

## 2021-03-26 DIAGNOSIS — Z1389 Encounter for screening for other disorder: Secondary | ICD-10-CM | POA: Diagnosis not present

## 2021-04-10 DIAGNOSIS — Z0000 Encounter for general adult medical examination without abnormal findings: Secondary | ICD-10-CM | POA: Diagnosis not present

## 2021-04-10 DIAGNOSIS — M1990 Unspecified osteoarthritis, unspecified site: Secondary | ICD-10-CM | POA: Diagnosis not present

## 2021-04-10 DIAGNOSIS — J418 Mixed simple and mucopurulent chronic bronchitis: Secondary | ICD-10-CM | POA: Diagnosis not present

## 2021-04-10 DIAGNOSIS — I10 Essential (primary) hypertension: Secondary | ICD-10-CM | POA: Diagnosis not present

## 2021-04-10 DIAGNOSIS — G8929 Other chronic pain: Secondary | ICD-10-CM | POA: Diagnosis not present

## 2021-04-10 DIAGNOSIS — M4727 Other spondylosis with radiculopathy, lumbosacral region: Secondary | ICD-10-CM | POA: Diagnosis not present

## 2021-04-10 DIAGNOSIS — N138 Other obstructive and reflux uropathy: Secondary | ICD-10-CM | POA: Diagnosis not present

## 2021-04-16 DIAGNOSIS — M47817 Spondylosis without myelopathy or radiculopathy, lumbosacral region: Secondary | ICD-10-CM | POA: Diagnosis not present

## 2021-04-16 DIAGNOSIS — M5136 Other intervertebral disc degeneration, lumbar region: Secondary | ICD-10-CM | POA: Diagnosis not present

## 2021-04-25 DIAGNOSIS — M5136 Other intervertebral disc degeneration, lumbar region: Secondary | ICD-10-CM | POA: Diagnosis not present

## 2021-04-25 DIAGNOSIS — G894 Chronic pain syndrome: Secondary | ICD-10-CM | POA: Diagnosis not present

## 2021-04-25 DIAGNOSIS — M47816 Spondylosis without myelopathy or radiculopathy, lumbar region: Secondary | ICD-10-CM | POA: Diagnosis not present

## 2021-04-26 DIAGNOSIS — M6281 Muscle weakness (generalized): Secondary | ICD-10-CM | POA: Diagnosis not present

## 2021-04-26 DIAGNOSIS — M545 Low back pain, unspecified: Secondary | ICD-10-CM | POA: Diagnosis not present

## 2021-04-30 DIAGNOSIS — M6281 Muscle weakness (generalized): Secondary | ICD-10-CM | POA: Diagnosis not present

## 2021-04-30 DIAGNOSIS — M545 Low back pain, unspecified: Secondary | ICD-10-CM | POA: Diagnosis not present

## 2021-05-01 DIAGNOSIS — M5116 Intervertebral disc disorders with radiculopathy, lumbar region: Secondary | ICD-10-CM | POA: Diagnosis not present

## 2021-05-03 DIAGNOSIS — M545 Low back pain, unspecified: Secondary | ICD-10-CM | POA: Diagnosis not present

## 2021-05-03 DIAGNOSIS — M6281 Muscle weakness (generalized): Secondary | ICD-10-CM | POA: Diagnosis not present

## 2021-05-06 DIAGNOSIS — M545 Low back pain, unspecified: Secondary | ICD-10-CM | POA: Diagnosis not present

## 2021-05-06 DIAGNOSIS — M6281 Muscle weakness (generalized): Secondary | ICD-10-CM | POA: Diagnosis not present

## 2021-05-09 DIAGNOSIS — M6281 Muscle weakness (generalized): Secondary | ICD-10-CM | POA: Diagnosis not present

## 2021-05-09 DIAGNOSIS — M545 Low back pain, unspecified: Secondary | ICD-10-CM | POA: Diagnosis not present

## 2021-05-13 DIAGNOSIS — M545 Low back pain, unspecified: Secondary | ICD-10-CM | POA: Diagnosis not present

## 2021-05-13 DIAGNOSIS — M6281 Muscle weakness (generalized): Secondary | ICD-10-CM | POA: Diagnosis not present

## 2021-05-15 DIAGNOSIS — M6281 Muscle weakness (generalized): Secondary | ICD-10-CM | POA: Diagnosis not present

## 2021-05-15 DIAGNOSIS — M545 Low back pain, unspecified: Secondary | ICD-10-CM | POA: Diagnosis not present

## 2021-05-21 DIAGNOSIS — M6281 Muscle weakness (generalized): Secondary | ICD-10-CM | POA: Diagnosis not present

## 2021-05-21 DIAGNOSIS — M545 Low back pain, unspecified: Secondary | ICD-10-CM | POA: Diagnosis not present

## 2021-05-22 DIAGNOSIS — M5116 Intervertebral disc disorders with radiculopathy, lumbar region: Secondary | ICD-10-CM | POA: Diagnosis not present

## 2021-05-22 DIAGNOSIS — M47817 Spondylosis without myelopathy or radiculopathy, lumbosacral region: Secondary | ICD-10-CM | POA: Diagnosis not present

## 2021-05-23 DIAGNOSIS — M5136 Other intervertebral disc degeneration, lumbar region: Secondary | ICD-10-CM | POA: Diagnosis not present

## 2021-05-23 DIAGNOSIS — M47816 Spondylosis without myelopathy or radiculopathy, lumbar region: Secondary | ICD-10-CM | POA: Diagnosis not present

## 2021-05-27 DIAGNOSIS — N138 Other obstructive and reflux uropathy: Secondary | ICD-10-CM | POA: Diagnosis not present

## 2021-05-27 DIAGNOSIS — M1A09X0 Idiopathic chronic gout, multiple sites, without tophus (tophi): Secondary | ICD-10-CM | POA: Diagnosis not present

## 2021-05-27 DIAGNOSIS — R072 Precordial pain: Secondary | ICD-10-CM | POA: Diagnosis not present

## 2021-05-27 DIAGNOSIS — M1990 Unspecified osteoarthritis, unspecified site: Secondary | ICD-10-CM | POA: Diagnosis not present

## 2021-05-27 DIAGNOSIS — G8929 Other chronic pain: Secondary | ICD-10-CM | POA: Diagnosis not present

## 2021-05-30 DIAGNOSIS — M6281 Muscle weakness (generalized): Secondary | ICD-10-CM | POA: Diagnosis not present

## 2021-05-30 DIAGNOSIS — M545 Low back pain, unspecified: Secondary | ICD-10-CM | POA: Diagnosis not present

## 2021-06-05 DIAGNOSIS — M545 Low back pain, unspecified: Secondary | ICD-10-CM | POA: Diagnosis not present

## 2021-06-05 DIAGNOSIS — M6281 Muscle weakness (generalized): Secondary | ICD-10-CM | POA: Diagnosis not present

## 2021-06-11 DIAGNOSIS — M47816 Spondylosis without myelopathy or radiculopathy, lumbar region: Secondary | ICD-10-CM | POA: Diagnosis not present

## 2021-06-12 DIAGNOSIS — M545 Low back pain, unspecified: Secondary | ICD-10-CM | POA: Diagnosis not present

## 2021-06-12 DIAGNOSIS — M6281 Muscle weakness (generalized): Secondary | ICD-10-CM | POA: Diagnosis not present

## 2021-06-13 DIAGNOSIS — G894 Chronic pain syndrome: Secondary | ICD-10-CM | POA: Diagnosis not present

## 2021-06-13 DIAGNOSIS — M47816 Spondylosis without myelopathy or radiculopathy, lumbar region: Secondary | ICD-10-CM | POA: Diagnosis not present

## 2021-06-13 DIAGNOSIS — M5136 Other intervertebral disc degeneration, lumbar region: Secondary | ICD-10-CM | POA: Diagnosis not present

## 2021-06-14 DIAGNOSIS — M6281 Muscle weakness (generalized): Secondary | ICD-10-CM | POA: Diagnosis not present

## 2021-06-14 DIAGNOSIS — M545 Low back pain, unspecified: Secondary | ICD-10-CM | POA: Diagnosis not present

## 2021-06-21 DIAGNOSIS — M6281 Muscle weakness (generalized): Secondary | ICD-10-CM | POA: Diagnosis not present

## 2021-06-21 DIAGNOSIS — M545 Low back pain, unspecified: Secondary | ICD-10-CM | POA: Diagnosis not present

## 2021-06-25 DIAGNOSIS — Z7902 Long term (current) use of antithrombotics/antiplatelets: Secondary | ICD-10-CM | POA: Diagnosis not present

## 2021-06-25 DIAGNOSIS — R0609 Other forms of dyspnea: Secondary | ICD-10-CM | POA: Diagnosis not present

## 2021-06-25 DIAGNOSIS — R42 Dizziness and giddiness: Secondary | ICD-10-CM | POA: Diagnosis not present

## 2021-06-25 DIAGNOSIS — Z79899 Other long term (current) drug therapy: Secondary | ICD-10-CM | POA: Diagnosis not present

## 2021-06-25 DIAGNOSIS — I340 Nonrheumatic mitral (valve) insufficiency: Secondary | ICD-10-CM | POA: Diagnosis not present

## 2021-06-25 DIAGNOSIS — R000 Tachycardia, unspecified: Secondary | ICD-10-CM | POA: Diagnosis not present

## 2021-06-25 DIAGNOSIS — R079 Chest pain, unspecified: Secondary | ICD-10-CM | POA: Diagnosis not present

## 2021-06-25 DIAGNOSIS — Z20822 Contact with and (suspected) exposure to covid-19: Secondary | ICD-10-CM | POA: Diagnosis not present

## 2021-06-25 DIAGNOSIS — Z23 Encounter for immunization: Secondary | ICD-10-CM | POA: Diagnosis not present

## 2021-06-25 DIAGNOSIS — Z79891 Long term (current) use of opiate analgesic: Secondary | ICD-10-CM | POA: Diagnosis not present

## 2021-06-25 DIAGNOSIS — I351 Nonrheumatic aortic (valve) insufficiency: Secondary | ICD-10-CM | POA: Diagnosis not present

## 2021-06-25 DIAGNOSIS — I10 Essential (primary) hypertension: Secondary | ICD-10-CM | POA: Diagnosis not present

## 2021-06-27 DIAGNOSIS — R0789 Other chest pain: Secondary | ICD-10-CM | POA: Diagnosis not present

## 2021-06-27 DIAGNOSIS — I351 Nonrheumatic aortic (valve) insufficiency: Secondary | ICD-10-CM | POA: Diagnosis not present

## 2021-06-27 DIAGNOSIS — R079 Chest pain, unspecified: Secondary | ICD-10-CM | POA: Diagnosis not present

## 2021-06-27 DIAGNOSIS — R000 Tachycardia, unspecified: Secondary | ICD-10-CM | POA: Diagnosis not present

## 2021-07-01 DIAGNOSIS — M47816 Spondylosis without myelopathy or radiculopathy, lumbar region: Secondary | ICD-10-CM | POA: Diagnosis not present

## 2021-07-01 DIAGNOSIS — R072 Precordial pain: Secondary | ICD-10-CM | POA: Diagnosis not present

## 2021-07-01 DIAGNOSIS — I10 Essential (primary) hypertension: Secondary | ICD-10-CM | POA: Diagnosis not present

## 2021-07-01 DIAGNOSIS — I160 Hypertensive urgency: Secondary | ICD-10-CM | POA: Diagnosis not present

## 2021-07-01 DIAGNOSIS — I2510 Atherosclerotic heart disease of native coronary artery without angina pectoris: Secondary | ICD-10-CM | POA: Diagnosis not present

## 2021-07-01 DIAGNOSIS — G8929 Other chronic pain: Secondary | ICD-10-CM | POA: Diagnosis not present

## 2021-07-01 DIAGNOSIS — I700 Atherosclerosis of aorta: Secondary | ICD-10-CM | POA: Diagnosis not present

## 2021-07-01 DIAGNOSIS — G720 Drug-induced myopathy: Secondary | ICD-10-CM | POA: Diagnosis not present

## 2021-07-01 DIAGNOSIS — E785 Hyperlipidemia, unspecified: Secondary | ICD-10-CM | POA: Diagnosis not present

## 2021-07-01 DIAGNOSIS — I2584 Coronary atherosclerosis due to calcified coronary lesion: Secondary | ICD-10-CM | POA: Diagnosis not present

## 2021-07-04 DIAGNOSIS — I10 Essential (primary) hypertension: Secondary | ICD-10-CM | POA: Diagnosis not present

## 2021-07-04 DIAGNOSIS — J418 Mixed simple and mucopurulent chronic bronchitis: Secondary | ICD-10-CM | POA: Diagnosis not present

## 2021-07-04 DIAGNOSIS — Z91199 Patient's noncompliance with other medical treatment and regimen due to unspecified reason: Secondary | ICD-10-CM | POA: Diagnosis not present

## 2021-07-06 DIAGNOSIS — M6281 Muscle weakness (generalized): Secondary | ICD-10-CM | POA: Diagnosis not present

## 2021-07-16 DIAGNOSIS — M5136 Other intervertebral disc degeneration, lumbar region: Secondary | ICD-10-CM | POA: Diagnosis not present

## 2021-07-16 DIAGNOSIS — M47816 Spondylosis without myelopathy or radiculopathy, lumbar region: Secondary | ICD-10-CM | POA: Diagnosis not present

## 2021-07-16 DIAGNOSIS — G894 Chronic pain syndrome: Secondary | ICD-10-CM | POA: Diagnosis not present

## 2021-08-05 DIAGNOSIS — M6281 Muscle weakness (generalized): Secondary | ICD-10-CM | POA: Diagnosis not present

## 2021-08-12 DIAGNOSIS — I10 Essential (primary) hypertension: Secondary | ICD-10-CM | POA: Diagnosis not present

## 2021-08-12 DIAGNOSIS — I160 Hypertensive urgency: Secondary | ICD-10-CM | POA: Diagnosis not present

## 2021-08-12 DIAGNOSIS — R7989 Other specified abnormal findings of blood chemistry: Secondary | ICD-10-CM | POA: Diagnosis not present

## 2021-08-13 DIAGNOSIS — M47816 Spondylosis without myelopathy or radiculopathy, lumbar region: Secondary | ICD-10-CM | POA: Diagnosis not present

## 2021-08-13 DIAGNOSIS — M5136 Other intervertebral disc degeneration, lumbar region: Secondary | ICD-10-CM | POA: Diagnosis not present

## 2021-08-23 DIAGNOSIS — J441 Chronic obstructive pulmonary disease with (acute) exacerbation: Secondary | ICD-10-CM | POA: Diagnosis not present

## 2021-09-05 DIAGNOSIS — M6281 Muscle weakness (generalized): Secondary | ICD-10-CM | POA: Diagnosis not present

## 2021-09-11 DIAGNOSIS — M47816 Spondylosis without myelopathy or radiculopathy, lumbar region: Secondary | ICD-10-CM | POA: Diagnosis not present

## 2021-09-16 DIAGNOSIS — G894 Chronic pain syndrome: Secondary | ICD-10-CM | POA: Diagnosis not present

## 2021-09-16 DIAGNOSIS — M5136 Other intervertebral disc degeneration, lumbar region: Secondary | ICD-10-CM | POA: Diagnosis not present

## 2021-09-16 DIAGNOSIS — M47816 Spondylosis without myelopathy or radiculopathy, lumbar region: Secondary | ICD-10-CM | POA: Diagnosis not present

## 2021-09-25 DIAGNOSIS — M47816 Spondylosis without myelopathy or radiculopathy, lumbar region: Secondary | ICD-10-CM | POA: Diagnosis not present

## 2021-10-06 DIAGNOSIS — M6281 Muscle weakness (generalized): Secondary | ICD-10-CM | POA: Diagnosis not present

## 2021-10-09 DIAGNOSIS — I2510 Atherosclerotic heart disease of native coronary artery without angina pectoris: Secondary | ICD-10-CM | POA: Diagnosis not present

## 2021-10-09 DIAGNOSIS — M1990 Unspecified osteoarthritis, unspecified site: Secondary | ICD-10-CM | POA: Diagnosis not present

## 2021-10-09 DIAGNOSIS — M4727 Other spondylosis with radiculopathy, lumbosacral region: Secondary | ICD-10-CM | POA: Diagnosis not present

## 2021-10-09 DIAGNOSIS — I2699 Other pulmonary embolism without acute cor pulmonale: Secondary | ICD-10-CM | POA: Diagnosis not present

## 2021-10-09 DIAGNOSIS — G8929 Other chronic pain: Secondary | ICD-10-CM | POA: Diagnosis not present

## 2021-10-09 DIAGNOSIS — J418 Mixed simple and mucopurulent chronic bronchitis: Secondary | ICD-10-CM | POA: Diagnosis not present

## 2021-10-09 DIAGNOSIS — E785 Hyperlipidemia, unspecified: Secondary | ICD-10-CM | POA: Diagnosis not present

## 2021-10-09 DIAGNOSIS — J324 Chronic pansinusitis: Secondary | ICD-10-CM | POA: Diagnosis not present

## 2021-10-09 DIAGNOSIS — G720 Drug-induced myopathy: Secondary | ICD-10-CM | POA: Diagnosis not present

## 2021-10-09 DIAGNOSIS — I700 Atherosclerosis of aorta: Secondary | ICD-10-CM | POA: Diagnosis not present

## 2021-10-09 DIAGNOSIS — R918 Other nonspecific abnormal finding of lung field: Secondary | ICD-10-CM | POA: Diagnosis not present

## 2021-10-09 DIAGNOSIS — I10 Essential (primary) hypertension: Secondary | ICD-10-CM | POA: Diagnosis not present

## 2021-10-14 DIAGNOSIS — G894 Chronic pain syndrome: Secondary | ICD-10-CM | POA: Diagnosis not present

## 2021-10-14 DIAGNOSIS — M47816 Spondylosis without myelopathy or radiculopathy, lumbar region: Secondary | ICD-10-CM | POA: Diagnosis not present

## 2021-10-14 DIAGNOSIS — M5136 Other intervertebral disc degeneration, lumbar region: Secondary | ICD-10-CM | POA: Diagnosis not present

## 2021-10-23 DIAGNOSIS — M5136 Other intervertebral disc degeneration, lumbar region: Secondary | ICD-10-CM | POA: Diagnosis not present

## 2021-10-23 DIAGNOSIS — M47817 Spondylosis without myelopathy or radiculopathy, lumbosacral region: Secondary | ICD-10-CM | POA: Diagnosis not present

## 2021-11-03 DIAGNOSIS — M6281 Muscle weakness (generalized): Secondary | ICD-10-CM | POA: Diagnosis not present

## 2021-11-11 DIAGNOSIS — M47816 Spondylosis without myelopathy or radiculopathy, lumbar region: Secondary | ICD-10-CM | POA: Diagnosis not present

## 2021-11-11 DIAGNOSIS — Z79891 Long term (current) use of opiate analgesic: Secondary | ICD-10-CM | POA: Diagnosis not present

## 2021-11-11 DIAGNOSIS — G894 Chronic pain syndrome: Secondary | ICD-10-CM | POA: Diagnosis not present

## 2021-11-11 DIAGNOSIS — M5136 Other intervertebral disc degeneration, lumbar region: Secondary | ICD-10-CM | POA: Diagnosis not present

## 2021-11-21 DIAGNOSIS — G8929 Other chronic pain: Secondary | ICD-10-CM | POA: Diagnosis not present

## 2021-11-21 DIAGNOSIS — J418 Mixed simple and mucopurulent chronic bronchitis: Secondary | ICD-10-CM | POA: Diagnosis not present

## 2021-11-21 DIAGNOSIS — J324 Chronic pansinusitis: Secondary | ICD-10-CM | POA: Diagnosis not present

## 2021-11-21 DIAGNOSIS — M1990 Unspecified osteoarthritis, unspecified site: Secondary | ICD-10-CM | POA: Diagnosis not present

## 2021-11-21 DIAGNOSIS — K648 Other hemorrhoids: Secondary | ICD-10-CM | POA: Diagnosis not present

## 2021-11-21 DIAGNOSIS — K921 Melena: Secondary | ICD-10-CM | POA: Diagnosis not present

## 2021-11-21 DIAGNOSIS — M4727 Other spondylosis with radiculopathy, lumbosacral region: Secondary | ICD-10-CM | POA: Diagnosis not present

## 2021-11-25 DIAGNOSIS — M5116 Intervertebral disc disorders with radiculopathy, lumbar region: Secondary | ICD-10-CM | POA: Diagnosis not present

## 2021-12-04 DIAGNOSIS — M6281 Muscle weakness (generalized): Secondary | ICD-10-CM | POA: Diagnosis not present

## 2021-12-13 DIAGNOSIS — M47816 Spondylosis without myelopathy or radiculopathy, lumbar region: Secondary | ICD-10-CM | POA: Diagnosis not present

## 2021-12-13 DIAGNOSIS — G894 Chronic pain syndrome: Secondary | ICD-10-CM | POA: Diagnosis not present

## 2021-12-13 DIAGNOSIS — M5136 Other intervertebral disc degeneration, lumbar region: Secondary | ICD-10-CM | POA: Diagnosis not present

## 2021-12-16 DIAGNOSIS — M47817 Spondylosis without myelopathy or radiculopathy, lumbosacral region: Secondary | ICD-10-CM | POA: Diagnosis not present

## 2021-12-20 DIAGNOSIS — M47816 Spondylosis without myelopathy or radiculopathy, lumbar region: Secondary | ICD-10-CM | POA: Diagnosis not present

## 2021-12-20 DIAGNOSIS — M4316 Spondylolisthesis, lumbar region: Secondary | ICD-10-CM | POA: Diagnosis not present

## 2021-12-20 DIAGNOSIS — M5116 Intervertebral disc disorders with radiculopathy, lumbar region: Secondary | ICD-10-CM | POA: Diagnosis not present

## 2021-12-26 DIAGNOSIS — H2513 Age-related nuclear cataract, bilateral: Secondary | ICD-10-CM | POA: Diagnosis not present

## 2021-12-29 DIAGNOSIS — J449 Chronic obstructive pulmonary disease, unspecified: Secondary | ICD-10-CM | POA: Diagnosis not present

## 2021-12-29 DIAGNOSIS — S0501XA Injury of conjunctiva and corneal abrasion without foreign body, right eye, initial encounter: Secondary | ICD-10-CM | POA: Diagnosis not present

## 2021-12-29 DIAGNOSIS — H5711 Ocular pain, right eye: Secondary | ICD-10-CM | POA: Diagnosis not present

## 2022-01-03 DIAGNOSIS — M6281 Muscle weakness (generalized): Secondary | ICD-10-CM | POA: Diagnosis not present

## 2022-01-10 DIAGNOSIS — G894 Chronic pain syndrome: Secondary | ICD-10-CM | POA: Diagnosis not present

## 2022-01-10 DIAGNOSIS — Z79891 Long term (current) use of opiate analgesic: Secondary | ICD-10-CM | POA: Diagnosis not present

## 2022-01-10 DIAGNOSIS — M5136 Other intervertebral disc degeneration, lumbar region: Secondary | ICD-10-CM | POA: Diagnosis not present

## 2022-01-10 DIAGNOSIS — M47816 Spondylosis without myelopathy or radiculopathy, lumbar region: Secondary | ICD-10-CM | POA: Diagnosis not present

## 2022-01-17 DIAGNOSIS — Z20822 Contact with and (suspected) exposure to covid-19: Secondary | ICD-10-CM | POA: Diagnosis not present

## 2022-01-17 DIAGNOSIS — J449 Chronic obstructive pulmonary disease, unspecified: Secondary | ICD-10-CM | POA: Diagnosis not present

## 2022-01-21 DIAGNOSIS — J418 Mixed simple and mucopurulent chronic bronchitis: Secondary | ICD-10-CM | POA: Diagnosis not present

## 2022-01-21 DIAGNOSIS — B356 Tinea cruris: Secondary | ICD-10-CM | POA: Diagnosis not present

## 2022-01-21 DIAGNOSIS — J441 Chronic obstructive pulmonary disease with (acute) exacerbation: Secondary | ICD-10-CM | POA: Diagnosis not present

## 2022-01-22 DIAGNOSIS — M47817 Spondylosis without myelopathy or radiculopathy, lumbosacral region: Secondary | ICD-10-CM | POA: Diagnosis not present

## 2022-02-03 DIAGNOSIS — M6281 Muscle weakness (generalized): Secondary | ICD-10-CM | POA: Diagnosis not present

## 2022-02-05 DIAGNOSIS — M5116 Intervertebral disc disorders with radiculopathy, lumbar region: Secondary | ICD-10-CM | POA: Insufficient documentation

## 2022-02-05 HISTORY — DX: Intervertebral disc disorders with radiculopathy, lumbar region: M51.16

## 2022-02-11 DIAGNOSIS — M47816 Spondylosis without myelopathy or radiculopathy, lumbar region: Secondary | ICD-10-CM | POA: Diagnosis not present

## 2022-02-11 DIAGNOSIS — Z79891 Long term (current) use of opiate analgesic: Secondary | ICD-10-CM | POA: Diagnosis not present

## 2022-02-11 DIAGNOSIS — M5136 Other intervertebral disc degeneration, lumbar region: Secondary | ICD-10-CM | POA: Diagnosis not present

## 2022-02-11 DIAGNOSIS — G894 Chronic pain syndrome: Secondary | ICD-10-CM | POA: Diagnosis not present

## 2022-02-21 DIAGNOSIS — I10 Essential (primary) hypertension: Secondary | ICD-10-CM | POA: Diagnosis not present

## 2022-02-21 DIAGNOSIS — Z881 Allergy status to other antibiotic agents status: Secondary | ICD-10-CM | POA: Diagnosis not present

## 2022-02-21 DIAGNOSIS — R200 Anesthesia of skin: Secondary | ICD-10-CM | POA: Diagnosis not present

## 2022-02-21 DIAGNOSIS — Z79899 Other long term (current) drug therapy: Secondary | ICD-10-CM | POA: Diagnosis not present

## 2022-02-21 DIAGNOSIS — G4700 Insomnia, unspecified: Secondary | ICD-10-CM | POA: Diagnosis not present

## 2022-02-21 DIAGNOSIS — J45909 Unspecified asthma, uncomplicated: Secondary | ICD-10-CM | POA: Diagnosis not present

## 2022-02-22 HISTORY — PX: LAMINECTOMY: SHX219

## 2022-02-28 DIAGNOSIS — M4716 Other spondylosis with myelopathy, lumbar region: Secondary | ICD-10-CM | POA: Diagnosis not present

## 2022-02-28 DIAGNOSIS — M47817 Spondylosis without myelopathy or radiculopathy, lumbosacral region: Secondary | ICD-10-CM | POA: Diagnosis not present

## 2022-02-28 DIAGNOSIS — M5116 Intervertebral disc disorders with radiculopathy, lumbar region: Secondary | ICD-10-CM | POA: Diagnosis not present

## 2022-03-05 DIAGNOSIS — M6281 Muscle weakness (generalized): Secondary | ICD-10-CM | POA: Diagnosis not present

## 2022-03-06 DIAGNOSIS — Z5181 Encounter for therapeutic drug level monitoring: Secondary | ICD-10-CM | POA: Diagnosis not present

## 2022-03-06 DIAGNOSIS — Z01812 Encounter for preprocedural laboratory examination: Secondary | ICD-10-CM | POA: Diagnosis not present

## 2022-03-06 DIAGNOSIS — R001 Bradycardia, unspecified: Secondary | ICD-10-CM | POA: Diagnosis not present

## 2022-03-06 DIAGNOSIS — Z01810 Encounter for preprocedural cardiovascular examination: Secondary | ICD-10-CM | POA: Diagnosis not present

## 2022-03-06 DIAGNOSIS — M5116 Intervertebral disc disorders with radiculopathy, lumbar region: Secondary | ICD-10-CM | POA: Diagnosis not present

## 2022-03-07 DIAGNOSIS — Z01810 Encounter for preprocedural cardiovascular examination: Secondary | ICD-10-CM | POA: Diagnosis not present

## 2022-03-11 DIAGNOSIS — M48061 Spinal stenosis, lumbar region without neurogenic claudication: Secondary | ICD-10-CM | POA: Diagnosis not present

## 2022-03-11 DIAGNOSIS — M4807 Spinal stenosis, lumbosacral region: Secondary | ICD-10-CM | POA: Diagnosis not present

## 2022-03-11 DIAGNOSIS — M4306 Spondylolysis, lumbar region: Secondary | ICD-10-CM | POA: Diagnosis not present

## 2022-03-11 DIAGNOSIS — M4326 Fusion of spine, lumbar region: Secondary | ICD-10-CM | POA: Diagnosis not present

## 2022-03-11 DIAGNOSIS — M48062 Spinal stenosis, lumbar region with neurogenic claudication: Secondary | ICD-10-CM | POA: Diagnosis not present

## 2022-03-11 DIAGNOSIS — M5116 Intervertebral disc disorders with radiculopathy, lumbar region: Secondary | ICD-10-CM | POA: Diagnosis not present

## 2022-03-11 DIAGNOSIS — M4716 Other spondylosis with myelopathy, lumbar region: Secondary | ICD-10-CM | POA: Diagnosis not present

## 2022-03-11 DIAGNOSIS — M4727 Other spondylosis with radiculopathy, lumbosacral region: Secondary | ICD-10-CM | POA: Diagnosis not present

## 2022-03-11 DIAGNOSIS — M5117 Intervertebral disc disorders with radiculopathy, lumbosacral region: Secondary | ICD-10-CM | POA: Diagnosis not present

## 2022-03-11 DIAGNOSIS — M545 Low back pain, unspecified: Secondary | ICD-10-CM | POA: Diagnosis not present

## 2022-03-11 DIAGNOSIS — M5106 Intervertebral disc disorders with myelopathy, lumbar region: Secondary | ICD-10-CM | POA: Diagnosis not present

## 2022-03-11 DIAGNOSIS — M5136 Other intervertebral disc degeneration, lumbar region: Secondary | ICD-10-CM | POA: Diagnosis not present

## 2022-03-12 DIAGNOSIS — M545 Low back pain, unspecified: Secondary | ICD-10-CM | POA: Diagnosis not present

## 2022-03-12 DIAGNOSIS — M47816 Spondylosis without myelopathy or radiculopathy, lumbar region: Secondary | ICD-10-CM | POA: Diagnosis not present

## 2022-03-12 DIAGNOSIS — M4807 Spinal stenosis, lumbosacral region: Secondary | ICD-10-CM | POA: Diagnosis not present

## 2022-03-12 DIAGNOSIS — J3489 Other specified disorders of nose and nasal sinuses: Secondary | ICD-10-CM | POA: Diagnosis not present

## 2022-03-12 DIAGNOSIS — Z981 Arthrodesis status: Secondary | ICD-10-CM | POA: Diagnosis not present

## 2022-03-12 DIAGNOSIS — M5116 Intervertebral disc disorders with radiculopathy, lumbar region: Secondary | ICD-10-CM | POA: Diagnosis not present

## 2022-03-12 DIAGNOSIS — M5117 Intervertebral disc disorders with radiculopathy, lumbosacral region: Secondary | ICD-10-CM | POA: Diagnosis not present

## 2022-03-12 DIAGNOSIS — J329 Chronic sinusitis, unspecified: Secondary | ICD-10-CM | POA: Diagnosis not present

## 2022-03-12 DIAGNOSIS — M4727 Other spondylosis with radiculopathy, lumbosacral region: Secondary | ICD-10-CM | POA: Diagnosis not present

## 2022-03-17 DIAGNOSIS — M47816 Spondylosis without myelopathy or radiculopathy, lumbar region: Secondary | ICD-10-CM | POA: Diagnosis not present

## 2022-03-17 DIAGNOSIS — M5136 Other intervertebral disc degeneration, lumbar region: Secondary | ICD-10-CM | POA: Diagnosis not present

## 2022-04-05 DIAGNOSIS — M6281 Muscle weakness (generalized): Secondary | ICD-10-CM | POA: Diagnosis not present

## 2022-04-07 DIAGNOSIS — M5116 Intervertebral disc disorders with radiculopathy, lumbar region: Secondary | ICD-10-CM | POA: Diagnosis not present

## 2022-04-15 DIAGNOSIS — M47816 Spondylosis without myelopathy or radiculopathy, lumbar region: Secondary | ICD-10-CM | POA: Diagnosis not present

## 2022-04-15 DIAGNOSIS — Z1389 Encounter for screening for other disorder: Secondary | ICD-10-CM | POA: Diagnosis not present

## 2022-04-15 DIAGNOSIS — M5136 Other intervertebral disc degeneration, lumbar region: Secondary | ICD-10-CM | POA: Diagnosis not present

## 2022-04-15 DIAGNOSIS — G894 Chronic pain syndrome: Secondary | ICD-10-CM | POA: Diagnosis not present

## 2022-04-22 DIAGNOSIS — E785 Hyperlipidemia, unspecified: Secondary | ICD-10-CM | POA: Diagnosis not present

## 2022-04-22 DIAGNOSIS — M7989 Other specified soft tissue disorders: Secondary | ICD-10-CM | POA: Diagnosis not present

## 2022-04-22 DIAGNOSIS — Z79899 Other long term (current) drug therapy: Secondary | ICD-10-CM | POA: Diagnosis not present

## 2022-04-22 DIAGNOSIS — Z0000 Encounter for general adult medical examination without abnormal findings: Secondary | ICD-10-CM | POA: Diagnosis not present

## 2022-04-22 DIAGNOSIS — N138 Other obstructive and reflux uropathy: Secondary | ICD-10-CM | POA: Diagnosis not present

## 2022-04-22 DIAGNOSIS — L97921 Non-pressure chronic ulcer of unspecified part of left lower leg limited to breakdown of skin: Secondary | ICD-10-CM | POA: Diagnosis not present

## 2022-04-22 DIAGNOSIS — M79662 Pain in left lower leg: Secondary | ICD-10-CM | POA: Diagnosis not present

## 2022-04-22 DIAGNOSIS — R7302 Impaired glucose tolerance (oral): Secondary | ICD-10-CM | POA: Diagnosis not present

## 2022-04-25 ENCOUNTER — Other Ambulatory Visit: Payer: Self-pay | Admitting: Family Medicine

## 2022-04-25 DIAGNOSIS — L97921 Non-pressure chronic ulcer of unspecified part of left lower leg limited to breakdown of skin: Secondary | ICD-10-CM

## 2022-04-25 DIAGNOSIS — M79662 Pain in left lower leg: Secondary | ICD-10-CM

## 2022-04-29 ENCOUNTER — Ambulatory Visit
Admission: RE | Admit: 2022-04-29 | Discharge: 2022-04-29 | Disposition: A | Discharge status: Home / Self Care | Payer: Medicare Other | Source: Ambulatory Visit | Attending: Family Medicine | Admitting: Family Medicine

## 2022-04-29 DIAGNOSIS — R600 Localized edema: Secondary | ICD-10-CM | POA: Diagnosis not present

## 2022-04-29 DIAGNOSIS — M79662 Pain in left lower leg: Secondary | ICD-10-CM

## 2022-04-29 DIAGNOSIS — Z872 Personal history of diseases of the skin and subcutaneous tissue: Secondary | ICD-10-CM | POA: Diagnosis not present

## 2022-04-29 DIAGNOSIS — M79605 Pain in left leg: Secondary | ICD-10-CM | POA: Diagnosis not present

## 2022-04-29 DIAGNOSIS — L97921 Non-pressure chronic ulcer of unspecified part of left lower leg limited to breakdown of skin: Secondary | ICD-10-CM

## 2022-04-30 ENCOUNTER — Other Ambulatory Visit: Payer: Medicare Other

## 2022-04-30 ENCOUNTER — Inpatient Hospital Stay: Admission: RE | Admit: 2022-04-30 | Payer: Medicare Other | Source: Ambulatory Visit

## 2022-04-30 DIAGNOSIS — M47812 Spondylosis without myelopathy or radiculopathy, cervical region: Secondary | ICD-10-CM | POA: Diagnosis not present

## 2022-04-30 DIAGNOSIS — I10 Essential (primary) hypertension: Secondary | ICD-10-CM | POA: Diagnosis not present

## 2022-04-30 DIAGNOSIS — E878 Other disorders of electrolyte and fluid balance, not elsewhere classified: Secondary | ICD-10-CM | POA: Diagnosis not present

## 2022-04-30 DIAGNOSIS — Z791 Long term (current) use of non-steroidal anti-inflammatories (NSAID): Secondary | ICD-10-CM | POA: Diagnosis not present

## 2022-04-30 DIAGNOSIS — R0789 Other chest pain: Secondary | ICD-10-CM | POA: Diagnosis not present

## 2022-04-30 DIAGNOSIS — R42 Dizziness and giddiness: Secondary | ICD-10-CM | POA: Diagnosis not present

## 2022-04-30 DIAGNOSIS — Z86711 Personal history of pulmonary embolism: Secondary | ICD-10-CM | POA: Diagnosis not present

## 2022-04-30 DIAGNOSIS — Z79899 Other long term (current) drug therapy: Secondary | ICD-10-CM | POA: Diagnosis not present

## 2022-04-30 DIAGNOSIS — M1A9XX1 Chronic gout, unspecified, with tophus (tophi): Secondary | ICD-10-CM | POA: Diagnosis not present

## 2022-04-30 DIAGNOSIS — J449 Chronic obstructive pulmonary disease, unspecified: Secondary | ICD-10-CM | POA: Diagnosis not present

## 2022-04-30 DIAGNOSIS — M545 Low back pain, unspecified: Secondary | ICD-10-CM | POA: Diagnosis not present

## 2022-04-30 DIAGNOSIS — S0993XA Unspecified injury of face, initial encounter: Secondary | ICD-10-CM | POA: Diagnosis not present

## 2022-04-30 DIAGNOSIS — E871 Hypo-osmolality and hyponatremia: Secondary | ICD-10-CM | POA: Diagnosis not present

## 2022-04-30 DIAGNOSIS — Z792 Long term (current) use of antibiotics: Secondary | ICD-10-CM | POA: Diagnosis not present

## 2022-04-30 DIAGNOSIS — Z79891 Long term (current) use of opiate analgesic: Secondary | ICD-10-CM | POA: Diagnosis not present

## 2022-04-30 DIAGNOSIS — Z7901 Long term (current) use of anticoagulants: Secondary | ICD-10-CM | POA: Diagnosis not present

## 2022-04-30 DIAGNOSIS — G9340 Encephalopathy, unspecified: Secondary | ICD-10-CM | POA: Diagnosis not present

## 2022-04-30 DIAGNOSIS — S01511A Laceration without foreign body of lip, initial encounter: Secondary | ICD-10-CM | POA: Diagnosis not present

## 2022-04-30 DIAGNOSIS — F1090 Alcohol use, unspecified, uncomplicated: Secondary | ICD-10-CM | POA: Diagnosis not present

## 2022-04-30 DIAGNOSIS — S199XXA Unspecified injury of neck, initial encounter: Secondary | ICD-10-CM | POA: Diagnosis not present

## 2022-04-30 DIAGNOSIS — R270 Ataxia, unspecified: Secondary | ICD-10-CM | POA: Diagnosis not present

## 2022-04-30 DIAGNOSIS — S0181XA Laceration without foreign body of other part of head, initial encounter: Secondary | ICD-10-CM | POA: Diagnosis not present

## 2022-04-30 DIAGNOSIS — G4700 Insomnia, unspecified: Secondary | ICD-10-CM | POA: Diagnosis not present

## 2022-04-30 DIAGNOSIS — S060X0A Concussion without loss of consciousness, initial encounter: Secondary | ICD-10-CM | POA: Diagnosis not present

## 2022-04-30 DIAGNOSIS — Z981 Arthrodesis status: Secondary | ICD-10-CM | POA: Diagnosis not present

## 2022-05-01 DIAGNOSIS — F1090 Alcohol use, unspecified, uncomplicated: Secondary | ICD-10-CM | POA: Insufficient documentation

## 2022-05-01 DIAGNOSIS — G9340 Encephalopathy, unspecified: Secondary | ICD-10-CM | POA: Insufficient documentation

## 2022-05-01 DIAGNOSIS — J439 Emphysema, unspecified: Secondary | ICD-10-CM

## 2022-05-01 DIAGNOSIS — E871 Hypo-osmolality and hyponatremia: Secondary | ICD-10-CM | POA: Insufficient documentation

## 2022-05-01 DIAGNOSIS — S060XAA Concussion with loss of consciousness status unknown, initial encounter: Secondary | ICD-10-CM | POA: Insufficient documentation

## 2022-05-01 DIAGNOSIS — M545 Low back pain, unspecified: Secondary | ICD-10-CM

## 2022-05-01 DIAGNOSIS — Z86711 Personal history of pulmonary embolism: Secondary | ICD-10-CM | POA: Insufficient documentation

## 2022-05-01 DIAGNOSIS — I10 Essential (primary) hypertension: Secondary | ICD-10-CM

## 2022-05-01 DIAGNOSIS — G8929 Other chronic pain: Secondary | ICD-10-CM

## 2022-05-01 DIAGNOSIS — M1A9XX1 Chronic gout, unspecified, with tophus (tophi): Secondary | ICD-10-CM

## 2022-05-01 DIAGNOSIS — W19XXXA Unspecified fall, initial encounter: Secondary | ICD-10-CM

## 2022-05-01 HISTORY — DX: Encephalopathy, unspecified: G93.40

## 2022-05-01 HISTORY — DX: Emphysema, unspecified: J43.9

## 2022-05-01 HISTORY — DX: Chronic gout, unspecified, with tophus (tophi): M1A.9XX1

## 2022-05-01 HISTORY — DX: Hypo-osmolality and hyponatremia: E87.1

## 2022-05-01 HISTORY — DX: Low back pain, unspecified: M54.50

## 2022-05-01 HISTORY — DX: Unspecified fall, initial encounter: W19.XXXA

## 2022-05-01 HISTORY — DX: Alcohol use, unspecified, uncomplicated: F10.90

## 2022-05-01 HISTORY — DX: Personal history of pulmonary embolism: Z86.711

## 2022-05-01 HISTORY — DX: Concussion with loss of consciousness status unknown, initial encounter: S06.0XAA

## 2022-05-01 HISTORY — DX: Other chronic pain: G89.29

## 2022-05-01 HISTORY — DX: Essential (primary) hypertension: I10

## 2022-05-02 DIAGNOSIS — E871 Hypo-osmolality and hyponatremia: Secondary | ICD-10-CM | POA: Diagnosis not present

## 2022-05-05 ENCOUNTER — Ambulatory Visit
Admission: RE | Admit: 2022-05-05 | Discharge: 2022-05-05 | Disposition: A | Discharge status: Home / Self Care | Payer: Medicare Other | Source: Ambulatory Visit | Attending: Family Medicine | Admitting: Family Medicine

## 2022-05-05 DIAGNOSIS — M79662 Pain in left lower leg: Secondary | ICD-10-CM

## 2022-05-05 DIAGNOSIS — L97909 Non-pressure chronic ulcer of unspecified part of unspecified lower leg with unspecified severity: Secondary | ICD-10-CM | POA: Diagnosis not present

## 2022-05-05 DIAGNOSIS — L97921 Non-pressure chronic ulcer of unspecified part of left lower leg limited to breakdown of skin: Secondary | ICD-10-CM

## 2022-05-06 DIAGNOSIS — M6281 Muscle weakness (generalized): Secondary | ICD-10-CM | POA: Diagnosis not present

## 2022-05-07 DIAGNOSIS — E871 Hypo-osmolality and hyponatremia: Secondary | ICD-10-CM | POA: Diagnosis not present

## 2022-05-07 DIAGNOSIS — I10 Essential (primary) hypertension: Secondary | ICD-10-CM | POA: Diagnosis not present

## 2022-05-07 DIAGNOSIS — S060X0D Concussion without loss of consciousness, subsequent encounter: Secondary | ICD-10-CM | POA: Diagnosis not present

## 2022-05-07 DIAGNOSIS — Z9181 History of falling: Secondary | ICD-10-CM | POA: Diagnosis not present

## 2022-05-08 DIAGNOSIS — M5116 Intervertebral disc disorders with radiculopathy, lumbar region: Secondary | ICD-10-CM | POA: Diagnosis not present

## 2022-05-14 DIAGNOSIS — M47816 Spondylosis without myelopathy or radiculopathy, lumbar region: Secondary | ICD-10-CM | POA: Diagnosis not present

## 2022-05-14 DIAGNOSIS — M5136 Other intervertebral disc degeneration, lumbar region: Secondary | ICD-10-CM | POA: Diagnosis not present

## 2022-05-14 DIAGNOSIS — G894 Chronic pain syndrome: Secondary | ICD-10-CM | POA: Diagnosis not present

## 2022-06-05 DIAGNOSIS — M6281 Muscle weakness (generalized): Secondary | ICD-10-CM | POA: Diagnosis not present

## 2022-07-06 DIAGNOSIS — M6281 Muscle weakness (generalized): Secondary | ICD-10-CM | POA: Diagnosis not present

## 2022-07-08 DIAGNOSIS — G894 Chronic pain syndrome: Secondary | ICD-10-CM | POA: Diagnosis not present

## 2022-07-08 DIAGNOSIS — M47816 Spondylosis without myelopathy or radiculopathy, lumbar region: Secondary | ICD-10-CM | POA: Diagnosis not present

## 2022-07-08 DIAGNOSIS — M5136 Other intervertebral disc degeneration, lumbar region: Secondary | ICD-10-CM | POA: Diagnosis not present

## 2022-08-05 DIAGNOSIS — G894 Chronic pain syndrome: Secondary | ICD-10-CM | POA: Diagnosis not present

## 2022-08-05 DIAGNOSIS — M6281 Muscle weakness (generalized): Secondary | ICD-10-CM | POA: Diagnosis not present

## 2022-08-05 DIAGNOSIS — M5136 Other intervertebral disc degeneration, lumbar region: Secondary | ICD-10-CM | POA: Diagnosis not present

## 2022-08-05 DIAGNOSIS — M47816 Spondylosis without myelopathy or radiculopathy, lumbar region: Secondary | ICD-10-CM | POA: Diagnosis not present

## 2022-08-23 DIAGNOSIS — M6281 Muscle weakness (generalized): Secondary | ICD-10-CM | POA: Diagnosis not present

## 2022-09-04 DIAGNOSIS — M1A09X0 Idiopathic chronic gout, multiple sites, without tophus (tophi): Secondary | ICD-10-CM | POA: Diagnosis not present

## 2022-09-04 DIAGNOSIS — M4326 Fusion of spine, lumbar region: Secondary | ICD-10-CM | POA: Diagnosis not present

## 2022-09-04 DIAGNOSIS — R600 Localized edema: Secondary | ICD-10-CM | POA: Diagnosis not present

## 2022-09-04 DIAGNOSIS — M5116 Intervertebral disc disorders with radiculopathy, lumbar region: Secondary | ICD-10-CM | POA: Diagnosis not present

## 2022-09-08 DIAGNOSIS — Z79891 Long term (current) use of opiate analgesic: Secondary | ICD-10-CM | POA: Diagnosis not present

## 2022-09-08 DIAGNOSIS — G894 Chronic pain syndrome: Secondary | ICD-10-CM | POA: Diagnosis not present

## 2022-09-08 DIAGNOSIS — M179 Osteoarthritis of knee, unspecified: Secondary | ICD-10-CM | POA: Diagnosis not present

## 2022-09-08 DIAGNOSIS — M47816 Spondylosis without myelopathy or radiculopathy, lumbar region: Secondary | ICD-10-CM | POA: Diagnosis not present

## 2022-09-23 DIAGNOSIS — M1711 Unilateral primary osteoarthritis, right knee: Secondary | ICD-10-CM | POA: Diagnosis not present

## 2022-10-06 DIAGNOSIS — Z79891 Long term (current) use of opiate analgesic: Secondary | ICD-10-CM | POA: Diagnosis not present

## 2022-10-06 DIAGNOSIS — M5136 Other intervertebral disc degeneration, lumbar region: Secondary | ICD-10-CM | POA: Diagnosis not present

## 2022-10-06 DIAGNOSIS — G894 Chronic pain syndrome: Secondary | ICD-10-CM | POA: Diagnosis not present

## 2022-10-06 DIAGNOSIS — M47816 Spondylosis without myelopathy or radiculopathy, lumbar region: Secondary | ICD-10-CM | POA: Diagnosis not present

## 2022-10-30 DIAGNOSIS — Z79891 Long term (current) use of opiate analgesic: Secondary | ICD-10-CM | POA: Diagnosis not present

## 2022-10-30 DIAGNOSIS — M5136 Other intervertebral disc degeneration, lumbar region: Secondary | ICD-10-CM | POA: Diagnosis not present

## 2022-10-30 DIAGNOSIS — M47816 Spondylosis without myelopathy or radiculopathy, lumbar region: Secondary | ICD-10-CM | POA: Diagnosis not present

## 2022-10-30 DIAGNOSIS — M179 Osteoarthritis of knee, unspecified: Secondary | ICD-10-CM | POA: Diagnosis not present

## 2022-11-25 DIAGNOSIS — Z79891 Long term (current) use of opiate analgesic: Secondary | ICD-10-CM | POA: Diagnosis not present

## 2022-11-25 DIAGNOSIS — M5136 Other intervertebral disc degeneration, lumbar region: Secondary | ICD-10-CM | POA: Diagnosis not present

## 2022-11-25 DIAGNOSIS — M47816 Spondylosis without myelopathy or radiculopathy, lumbar region: Secondary | ICD-10-CM | POA: Diagnosis not present

## 2022-11-25 DIAGNOSIS — M179 Osteoarthritis of knee, unspecified: Secondary | ICD-10-CM | POA: Diagnosis not present

## 2022-12-24 DIAGNOSIS — M1711 Unilateral primary osteoarthritis, right knee: Secondary | ICD-10-CM | POA: Diagnosis not present

## 2022-12-24 DIAGNOSIS — M47816 Spondylosis without myelopathy or radiculopathy, lumbar region: Secondary | ICD-10-CM | POA: Diagnosis not present

## 2022-12-24 DIAGNOSIS — Z79891 Long term (current) use of opiate analgesic: Secondary | ICD-10-CM | POA: Diagnosis not present

## 2022-12-24 DIAGNOSIS — M179 Osteoarthritis of knee, unspecified: Secondary | ICD-10-CM | POA: Diagnosis not present

## 2022-12-24 DIAGNOSIS — G894 Chronic pain syndrome: Secondary | ICD-10-CM | POA: Diagnosis not present

## 2022-12-24 DIAGNOSIS — Z1389 Encounter for screening for other disorder: Secondary | ICD-10-CM | POA: Diagnosis not present

## 2022-12-31 DIAGNOSIS — I700 Atherosclerosis of aorta: Secondary | ICD-10-CM | POA: Diagnosis not present

## 2022-12-31 DIAGNOSIS — J418 Mixed simple and mucopurulent chronic bronchitis: Secondary | ICD-10-CM | POA: Diagnosis not present

## 2022-12-31 DIAGNOSIS — M1990 Unspecified osteoarthritis, unspecified site: Secondary | ICD-10-CM | POA: Diagnosis not present

## 2022-12-31 DIAGNOSIS — I10 Essential (primary) hypertension: Secondary | ICD-10-CM | POA: Diagnosis not present

## 2022-12-31 DIAGNOSIS — I25119 Atherosclerotic heart disease of native coronary artery with unspecified angina pectoris: Secondary | ICD-10-CM | POA: Diagnosis not present

## 2022-12-31 DIAGNOSIS — M1A09X0 Idiopathic chronic gout, multiple sites, without tophus (tophi): Secondary | ICD-10-CM | POA: Diagnosis not present

## 2022-12-31 DIAGNOSIS — M4727 Other spondylosis with radiculopathy, lumbosacral region: Secondary | ICD-10-CM | POA: Diagnosis not present

## 2022-12-31 DIAGNOSIS — E785 Hyperlipidemia, unspecified: Secondary | ICD-10-CM | POA: Diagnosis not present

## 2022-12-31 DIAGNOSIS — R7302 Impaired glucose tolerance (oral): Secondary | ICD-10-CM | POA: Diagnosis not present

## 2022-12-31 DIAGNOSIS — N138 Other obstructive and reflux uropathy: Secondary | ICD-10-CM | POA: Diagnosis not present

## 2022-12-31 DIAGNOSIS — G720 Drug-induced myopathy: Secondary | ICD-10-CM | POA: Diagnosis not present

## 2023-01-02 DIAGNOSIS — E871 Hypo-osmolality and hyponatremia: Secondary | ICD-10-CM | POA: Diagnosis not present

## 2023-01-05 DIAGNOSIS — E871 Hypo-osmolality and hyponatremia: Secondary | ICD-10-CM | POA: Diagnosis not present

## 2023-01-06 ENCOUNTER — Encounter: Payer: Self-pay | Admitting: *Deleted

## 2023-01-06 ENCOUNTER — Encounter: Payer: Self-pay | Admitting: Cardiology

## 2023-01-06 DIAGNOSIS — M1990 Unspecified osteoarthritis, unspecified site: Secondary | ICD-10-CM

## 2023-01-06 DIAGNOSIS — N5201 Erectile dysfunction due to arterial insufficiency: Secondary | ICD-10-CM

## 2023-01-06 DIAGNOSIS — N138 Other obstructive and reflux uropathy: Secondary | ICD-10-CM

## 2023-01-06 DIAGNOSIS — K5730 Diverticulosis of large intestine without perforation or abscess without bleeding: Secondary | ICD-10-CM

## 2023-01-06 DIAGNOSIS — I700 Atherosclerosis of aorta: Secondary | ICD-10-CM | POA: Insufficient documentation

## 2023-01-06 DIAGNOSIS — I2510 Atherosclerotic heart disease of native coronary artery without angina pectoris: Secondary | ICD-10-CM

## 2023-01-06 DIAGNOSIS — E785 Hyperlipidemia, unspecified: Secondary | ICD-10-CM

## 2023-01-08 DIAGNOSIS — E871 Hypo-osmolality and hyponatremia: Secondary | ICD-10-CM | POA: Diagnosis not present

## 2023-01-16 DIAGNOSIS — M1711 Unilateral primary osteoarthritis, right knee: Secondary | ICD-10-CM | POA: Diagnosis not present

## 2023-01-21 DIAGNOSIS — G894 Chronic pain syndrome: Secondary | ICD-10-CM | POA: Diagnosis not present

## 2023-01-21 DIAGNOSIS — M5136 Other intervertebral disc degeneration, lumbar region: Secondary | ICD-10-CM | POA: Diagnosis not present

## 2023-01-21 DIAGNOSIS — Z79891 Long term (current) use of opiate analgesic: Secondary | ICD-10-CM | POA: Diagnosis not present

## 2023-01-21 DIAGNOSIS — Z1389 Encounter for screening for other disorder: Secondary | ICD-10-CM | POA: Diagnosis not present

## 2023-01-21 DIAGNOSIS — M47816 Spondylosis without myelopathy or radiculopathy, lumbar region: Secondary | ICD-10-CM | POA: Diagnosis not present

## 2023-01-22 ENCOUNTER — Telehealth: Payer: Self-pay

## 2023-01-22 DIAGNOSIS — M1711 Unilateral primary osteoarthritis, right knee: Secondary | ICD-10-CM | POA: Diagnosis not present

## 2023-01-22 NOTE — Patient Outreach (Signed)
  Care Coordination   Initial Visit Note   01/22/2023 Name: [name] [name] MRN: [id number] DOB: [birth year]  [name] [name] is a 67 y.o. year old male who sees Street, [name] Coup, MD for primary care. I spoke with  [name] [name] by phone today.  What matters to the patients health and wellness today?  Placed call to patient to review and offer [facility] care coordination program. Patient reports that he is doing well and denies any needs today.     SDOH assessments and interventions completed:  No     Care Coordination Interventions:  No, not indicated   Follow up plan: No further intervention required.   Encounter Outcome:  Pt. Refused   Rowe Pavy, RN, BSN, CEN [facility] [employer] [phone]

## 2023-01-23 DIAGNOSIS — E871 Hypo-osmolality and hyponatremia: Secondary | ICD-10-CM | POA: Diagnosis not present

## 2023-01-23 DIAGNOSIS — B351 Tinea unguium: Secondary | ICD-10-CM | POA: Diagnosis not present

## 2023-01-23 DIAGNOSIS — I10 Essential (primary) hypertension: Secondary | ICD-10-CM | POA: Diagnosis not present

## 2023-01-26 ENCOUNTER — Other Ambulatory Visit: Payer: Self-pay

## 2023-01-26 DIAGNOSIS — R918 Other nonspecific abnormal finding of lung field: Secondary | ICD-10-CM | POA: Insufficient documentation

## 2023-01-26 DIAGNOSIS — F5104 Psychophysiologic insomnia: Secondary | ICD-10-CM | POA: Insufficient documentation

## 2023-01-29 NOTE — Progress Notes (Signed)
Cardiology Office Note:    Date:  01/30/2023   ID:  [name] [name], DOB 1955-10-12, MRN [id number]  PCP:  Street, Stephanie Coup, MD  Cardiologist:  Norman Herrlich, MD   Referring MD: [address] Stephanie Coup, *  ASSESSMENT:    1. SOB (shortness of breath)   2. Chest pain of uncertain etiology   3. Primary hypertension   4. Mixed hyperlipidemia   5. Coronary artery calcification    PLAN:    In order of problems listed above:  His predominant complaint is chronic exertional and intermittent nonexertional paroxysms of shortness of breath.  He has a background history of previous unprovoked pulmonary embolism and has been off anticoagulants for 1 year.  Unfortunately his late Friday afternoon I will not be able to get a CTA of the chest without an ED visit we decided to place him back on his anticoagulant and a CTA Monday morning to exclude recurrent or chronic pulmonary embolism.  Differential diagnosis includes LV dysfunction and valvular heart disease pulmonary hypertension check echocardiogram as well as anginal ischemic and LV myocardial perfusion study my office Continue his current antihypertensive amlodipine metoprolol Currently not on lipid-lowering therapy  Next appointment 4 weeks   Medication Adjustments/Labs and Tests Ordered: Current medicines are reviewed at length with the patient today.  Concerns regarding medicines are outlined above.  Orders Placed This Encounter  Procedures   CT Angio Chest Pulmonary Embolism (PE) W or WO Contrast   D-Dimer, Quantitative   Pro b natriuretic peptide (BNP)   MYOCARDIAL PERFUSION IMAGING   EKG 12-Lead   ECHOCARDIOGRAM COMPLETE   Meds ordered this encounter  Medications   apixaban (ELIQUIS) 5 MG TABS tablet    Sig: Take 1 tablet (5 mg total) by mouth 2 (two) times daily.    Dispense:  180 tablet    Refill:  3     Chief complaint I am increasingly short of breath  History of Present Illness:    [name] [name] is a 67 y.o.  male who is being seen today for the evaluation of chest pain at the request of Street, Stephanie Coup, *.  Chart review shows he had an echocardiogram at [facility] 06/25/2021.  Left ventricular function was normal EF 55 to 60% with normal left ventricular filling pressure and aortic valve sclerosis without stenosis but he did have mild aortic and mitral regurgitation.  He was seen with his primary care physician 12/31/2022.  He was experiencing exertional chest pressure.  His history relates that he is at hypertension hyperlipidemia COPD/asthma and chronic pain syndrome he has a history of a pulmonary embolism gout and coronary artery calcification seen on CT scan and a myocardial perfusion study [facility] November 2022 showing normal perfusion normal function low risk.  Image last March 2022 without contrast showed coronary artery and aortic atheroma sclerosis.  Although I cannot access records and relates he had normal coronary arteriography 20 years previously.  Laboratory studies showed a hemoglobin of 14.0 platelets 261,007 creatinine 1.0 GFR greater than 60 cc/min sodium 132 potassium 4.4.  His office EKG showed sinus rhythm nonspecific ST abnormality minor  He relates a pattern of shortness of breath and chest discomfort for about 5 years progressively worse now occurring more frequently and more severe He has been chronically short of breath with anything more than minor activity but also has intermittent paroxysmal severe shortness of breath he had an episode a night or 2 ago where he was very short of breath  and had pressure in his chest associated with that. The symptoms are not exertional he seems to be relieved with rest He tells me about 5 years ago he had a pulmonary embolism was placed on anticoagulant and he decided to stop it about a year ago His pulmonary embolism was unprovoked We will try to pull those records from [facility] He has no known history of  heart disease congenital rheumatic or atrial fibrillation He has had some peripheral edema not severe does not have orthopnea but short of breath bending over no palpitation or syncope Past Medical History:  Diagnosis Date   Acute encephalopathy 05/01/2022   Alcohol use disorder 05/01/2022   Aortic atherosclerosis (HCC)    BPH with obstruction/lower urinary tract symptoms    Chronic bilateral low back pain without sciatica 05/01/2022   Chronic insomnia    Chronic sinusitis 06/18/2020   Closed head injury with concussion 05/01/2022   Coronary artery calcification    Diverticulosis of colon    Dyslipidemia    Erectile dysfunction due to arterial insufficiency    Fall 05/01/2022   Foul smelling discharge from nose 06/18/2020   History of pulmonary embolus (PE) 05/01/2022   Hyponatremia 05/01/2022   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 Noel Gerold    Current Medications: Current Meds  Medication Sig   albuterol (VENTOLIN HFA) 108 (90 Base) MCG/ACT inhaler Inhale 1-2 puffs into the lungs every 4 (four) hours as needed for wheezing or shortness of breath.   amLODipine (NORVASC) 5 MG tablet Take 5 mg by mouth daily.   apixaban (ELIQUIS) 5 MG TABS tablet Take 1 tablet (5 mg total) by mouth 2 (two) times daily.   Budeson-Glycopyrrol-Formoterol (BREZTRI AEROSPHERE) 160-9-4.8 MCG/ACT AERO Inhale 2 puffs into the lungs 2 (two) times daily.   clotrimazole-betamethasone (LOTRISONE) cream Apply 1 Application topically daily.   diclofenac Sodium  (VOLTAREN) 1 % GEL Apply 1 Application topically every 4 (four) hours.   gabapentin (NEURONTIN) 300 MG capsule Take 300 mg by mouth 2 (two) times daily.   indomethacin (INDOCIN) 50 MG capsule Take 50 mg by mouth 2 (two) times daily with a meal.   ipratropium-albuterol (DUONEB) 0.5-2.5 (3) MG/3ML SOLN Inhale 3 mLs into the lungs every 4 (four) hours as needed (shortness of breath).   Lidocaine (ZTLIDO) 1.8 % PTCH Apply 1 patch topically every 12 (twelve) hours as needed (pain).   metoprolol succinate (TOPROL-XL) 50 MG 24 hr tablet Take 50 mg by mouth daily.   mupirocin ointment (BACTROBAN) 2 % Apply 1 Application topically daily.   naloxone (NARCAN) nasal spray 4 mg/0.1 mL Place 1 spray into the nose once.   oxyCODONE (ROXICODONE) 15 MG immediate release tablet Take 15 mg by mouth 4 (four) times daily as needed for pain.   tadalafil (CIALIS) 10 MG tablet Take 5-10 mg by mouth daily as needed for erectile dysfunction.   tamsulosin (FLOMAX) 0.4 MG CAPS capsule Take 0.4 mg by mouth at bedtime.   tiZANidine (ZANAFLEX) 4 MG tablet Take 4  mg by mouth 3 (three) times daily as needed for muscle spasms.   traZODone (DESYREL) 150 MG tablet Take 300 mg by mouth at bedtime.     Allergies:   Azithromycin, Simvastatin, and Codeine   Social History   Socioeconomic History   Marital status: Married    Spouse name: Not on file   Number of children: Not on file   Years of education: Not on file   Highest education level: Not on file  Occupational History   Not on file  Tobacco Use   Smoking status: Never   Smokeless tobacco: Never  Substance and Sexual Activity   Alcohol use: Yes    Alcohol/week: 28.0 - 35.0 standard drinks of alcohol    Types: 28 - 35 Cans of beer per week   Drug use: Not on file   Sexual activity: Not on file  Other Topics Concern   Not on file  Social History Narrative   Not on file   Social Determinants of Health   Financial Resource Strain: Not on file  Food Insecurity:  Not on file  Transportation Needs: Not on file  Physical Activity: Not on file  Stress: Not on file  Social Connections: Not on file     Family History: The patient's family history includes Coronary artery disease in his mother; Hypertension in his mother; Rheumatic fever in his father.  ROS:   ROS Please see the history of present illness.     All other systems reviewed and are negative.  EKGs/Labs/Other Studies Reviewed:    The following studies were reviewed today:       EKG:  EKG is  ordered today.  The ekg ordered today is personally reviewed and demonstrates sinus bradycardia 55 bpm otherwise normal EKG  Recent Labs: No results found for requested labs within last 365 days.  Recent Lipid Panel    Component Value Date/Time   CHOL  01/25/2010 0440    173        ATP III CLASSIFICATION:  <200     mg/dL   Desirable  829-562  mg/dL   Borderline High  >=130    mg/dL   High          TRIG 62 01/25/2010 0440   HDL 80 01/25/2010 0440   CHOLHDL 2.2 01/25/2010 0440   VLDL 12 01/25/2010 0440   LDLCALC  01/25/2010 0440    81        Total Cholesterol/HDL:CHD Risk Coronary Heart Disease Risk Table                     Men   Women  1/2 Average Risk   3.4   3.3  Average Risk       5.0   4.4  2 X Average Risk   9.6   7.1  3 X Average Risk  23.4   11.0        Use the calculated Patient Ratio above and the CHD Risk Table to determine the patient's CHD Risk.        ATP III CLASSIFICATION (LDL):  <100     mg/dL   Optimal  865-784  mg/dL   Near or Above                    Optimal  130-159  mg/dL   Borderline  696-295  mg/dL   High  >284     mg/dL   Very High  Physical Exam:    VS:  BP 130/78   Pulse (!) 55   Ht 5\' 9"  (1.753 m)   Wt 197 lb 6.4 oz (89.5 kg)   SpO2 95%   BMI 29.15 kg/m     Wt Readings from Last 3 Encounters:  01/30/23 197 lb 6.4 oz (89.5 kg)  12/31/22 194 lb (88 kg)     GEN: He does not look acutely or chronically ill well nourished, well  developed in no acute distress HEENT: Normal NECK: No JVD; No carotid bruits LYMPHATICS: No lymphadenopathy CARDIAC: RRR, no murmurs, rubs, gallops RESPIRATORY:  Clear to auscultation without rales, wheezing or rhonchi  ABDOMEN: Soft, non-tender, non-distended MUSCULOSKELETAL: Trace bilateral lower extremity above the ankle edema; No deformity  SKIN: Warm and dry NEUROLOGIC:  Alert and oriented x 3 PSYCHIATRIC:  Normal affect     Signed, Norman Herrlich, MD  01/30/2023 4:38 PM    [facility]

## 2023-01-30 ENCOUNTER — Encounter: Payer: Self-pay | Admitting: Cardiology

## 2023-01-30 ENCOUNTER — Ambulatory Visit: Payer: 59 | Attending: Cardiology | Admitting: Cardiology

## 2023-01-30 VITALS — BP 130/78 | HR 55 | Ht 69.0 in | Wt 197.4 lb

## 2023-01-30 DIAGNOSIS — I2584 Coronary atherosclerosis due to calcified coronary lesion: Secondary | ICD-10-CM | POA: Diagnosis not present

## 2023-01-30 DIAGNOSIS — E782 Mixed hyperlipidemia: Secondary | ICD-10-CM | POA: Diagnosis not present

## 2023-01-30 DIAGNOSIS — M1711 Unilateral primary osteoarthritis, right knee: Secondary | ICD-10-CM | POA: Diagnosis not present

## 2023-01-30 DIAGNOSIS — R079 Chest pain, unspecified: Secondary | ICD-10-CM

## 2023-01-30 DIAGNOSIS — I10 Essential (primary) hypertension: Secondary | ICD-10-CM

## 2023-01-30 DIAGNOSIS — R0602 Shortness of breath: Secondary | ICD-10-CM | POA: Diagnosis not present

## 2023-01-30 DIAGNOSIS — I2510 Atherosclerotic heart disease of native coronary artery without angina pectoris: Secondary | ICD-10-CM | POA: Diagnosis not present

## 2023-01-30 MED ORDER — APIXABAN 5 MG PO TABS: 5.0000 mg | ORAL_TABLET | Freq: Two times a day (BID) | ORAL | 3 refills | Status: DC

## 2023-01-30 NOTE — Patient Instructions (Signed)
Medication Instructions:  Your physician has recommended you make the following change in your medication:   START: Eliquis 5 mg twice daily  *If you need a refill on your cardiac medications before your next appointment, please call your pharmacy*   Lab Work: Your physician recommends that you return for lab work in:   Labs today: D-dimer, Pro BNP  If you have labs (blood work) drawn today and your tests are completely normal, you will receive your results only by: MyChart Message (if you have MyChart) OR A paper copy in the mail If you have any lab test that is abnormal or we need to change your treatment, we will call you to review the results.   Testing/Procedures: Your physician has requested that you have an echocardiogram. Echocardiography is a painless test that uses sound waves to create images of your heart. It provides your doctor with information about the size and shape of your heart and how well your heart's chambers and valves are working. This procedure takes approximately one hour. There are no restrictions for this procedure. Please do NOT wear cologne, perfume, aftershave, or lotions (deodorant is allowed). Please arrive 15 minutes prior to your appointment time.      [facility] [address] [place], [place] [address] Phone:  [phone]    Please arrive 15 minutes prior to your appointment time for registration and insurance purposes.  The test will take approximately 3 to 4 hours to complete; you may bring reading material.  If someone comes with you to your appointment, they will need to remain in the main lobby due to limited space in the testing area. **If you are pregnant or breastfeeding, please notify the nuclear lab prior to your appointment**  How to prepare for your Myocardial Perfusion Test: Do not eat or drink 3 hours prior to your test, except you may have water. Do not consume products containing caffeine  (regular or decaffeinated) 12 hours prior to your test. (ex: coffee, chocolate, sodas, tea). Do bring a list of your current medications with you.  If not listed below, you may take your medications as normal. HOLD Erectile dysfunction medication: Cialis,  Do wear comfortable clothes (no dresses or overalls) and walking shoes, tennis shoes preferred (No heels or open toe shoes are allowed). Do NOT wear cologne, perfume, aftershave, or lotions (deodorant is allowed). If these instructions are not followed, your test will have to be rescheduled.  Please report to [address] for your test.  If you have questions or concerns about your appointment, you can call the [facility] at [phone].  If you cannot keep your appointment, please provide 24 hours notification to the Nuclear Lab, to avoid a possible $50 charge to your account.     Follow-Up: At [facility], you and your health needs are our priority.  As part of our continuing mission to provide you with exceptional heart care, we have created designated Provider Care Teams.  These Care Teams include your primary Cardiologist (physician) and Advanced Practice Providers (APPs -  Physician Assistants and Nurse Practitioners) who all work together to provide you with the care you need, when you need it.  We recommend signing up for the patient portal called "MyChart".  Sign up information is provided on this After Visit Summary.  MyChart is used to connect with patients for Virtual Visits (Telemedicine).  Patients are able to view lab/test results, encounter notes, upcoming appointments,  etc.  Non-urgent messages can be sent to your provider as well.   To learn more about what you can do with MyChart, go to [email or web address].    Your next appointment:   3 week(s)  Provider:   Norman Herrlich, MD    Other Instructions None

## 2023-01-31 LAB — PRO B NATRIURETIC PEPTIDE: NT-Pro BNP: 197 pg/mL (ref 0–376)

## 2023-01-31 LAB — D-DIMER, QUANTITATIVE: D-DIMER: 1.08 mg/L FEU — ABNORMAL HIGH (ref 0.00–0.49)

## 2023-02-02 DIAGNOSIS — R0602 Shortness of breath: Secondary | ICD-10-CM | POA: Diagnosis not present

## 2023-02-02 DIAGNOSIS — R918 Other nonspecific abnormal finding of lung field: Secondary | ICD-10-CM | POA: Diagnosis not present

## 2023-02-03 ENCOUNTER — Ambulatory Visit: Payer: 59

## 2023-02-03 ENCOUNTER — Encounter: Payer: Self-pay | Admitting: Cardiology

## 2023-02-04 ENCOUNTER — Telehealth (HOSPITAL_COMMUNITY): Payer: Self-pay | Admitting: *Deleted

## 2023-02-04 NOTE — Telephone Encounter (Signed)
Left message on voicemail per DPR in reference to upcoming appointment scheduled on  02/10/23 with detailed instructions given per Myocardial Perfusion Study Information Sheet for the test. LM to arrive 15 minutes early, and that it is imperative to arrive on time for appointment to keep from having the test rescheduled. If you need to cancel or reschedule your appointment, please call the office within 24 hours of your appointment. Failure to do so may result in a cancellation of your appointment, and a $50 no show fee. Phone number given for call back for any questions. [name] [name]

## 2023-02-10 ENCOUNTER — Ambulatory Visit: Payer: 59 | Attending: Cardiology

## 2023-02-10 DIAGNOSIS — R079 Chest pain, unspecified: Secondary | ICD-10-CM

## 2023-02-10 DIAGNOSIS — I2510 Atherosclerotic heart disease of native coronary artery without angina pectoris: Secondary | ICD-10-CM | POA: Diagnosis not present

## 2023-02-10 DIAGNOSIS — I2584 Coronary atherosclerosis due to calcified coronary lesion: Secondary | ICD-10-CM | POA: Diagnosis not present

## 2023-02-10 DIAGNOSIS — E782 Mixed hyperlipidemia: Secondary | ICD-10-CM

## 2023-02-10 DIAGNOSIS — I10 Essential (primary) hypertension: Secondary | ICD-10-CM

## 2023-02-10 LAB — MYOCARDIAL PERFUSION IMAGING
LV dias vol: 104 mL (ref 62–150)
LV sys vol: 43 mL
Nuc Stress EF: 59 %
Peak HR: 70 {beats}/min
Rest HR: 45 {beats}/min
Rest Nuclear Isotope Dose: 10.1 mCi
SDS: 4
SRS: 3
SSS: 7
Stress Nuclear Isotope Dose: 31.7 mCi
TID: 1.04

## 2023-02-10 MED ORDER — REGADENOSON 0.4 MG/5ML IV SOLN
0.4000 mg | Freq: Once | INTRAVENOUS | Status: AC
Start: 2023-02-10 — End: 2023-02-10
  Administered 2023-02-10: 0.4 mg via INTRAVENOUS

## 2023-02-10 MED ORDER — TECHNETIUM TC 99M TETROFOSMIN IV KIT
31.7000 | PACK | Freq: Once | INTRAVENOUS | Status: AC | PRN
  Administered 2023-02-10: 31.7 via INTRAVENOUS

## 2023-02-10 MED ORDER — TECHNETIUM TC 99M TETROFOSMIN IV KIT
10.1000 | PACK | Freq: Once | INTRAVENOUS | Status: AC | PRN
  Administered 2023-02-10: 10.1 via INTRAVENOUS

## 2023-02-11 ENCOUNTER — Ambulatory Visit: Payer: 59 | Attending: Cardiology

## 2023-02-11 ENCOUNTER — Telehealth: Payer: Self-pay

## 2023-02-11 DIAGNOSIS — E782 Mixed hyperlipidemia: Secondary | ICD-10-CM | POA: Diagnosis not present

## 2023-02-11 DIAGNOSIS — R079 Chest pain, unspecified: Secondary | ICD-10-CM

## 2023-02-11 DIAGNOSIS — I2510 Atherosclerotic heart disease of native coronary artery without angina pectoris: Secondary | ICD-10-CM

## 2023-02-11 DIAGNOSIS — I10 Essential (primary) hypertension: Secondary | ICD-10-CM

## 2023-02-11 DIAGNOSIS — I2584 Coronary atherosclerosis due to calcified coronary lesion: Secondary | ICD-10-CM | POA: Diagnosis not present

## 2023-02-11 LAB — ECHOCARDIOGRAM COMPLETE
P 1/2 time: 652 msec
S' Lateral: 2.9 cm

## 2023-02-11 NOTE — Telephone Encounter (Signed)
Patient notified of results.

## 2023-02-11 NOTE — Telephone Encounter (Signed)
-----   Message from Flossie Dibble, NP sent at 02/10/2023  4:33 PM EDT ----- Mr. Thron,  I am the NP that works with Dr. Dulce Sellar.  Your stress test was normal, reassuring study! Best, Victorino Dike

## 2023-02-19 DIAGNOSIS — M5136 Other intervertebral disc degeneration, lumbar region: Secondary | ICD-10-CM | POA: Diagnosis not present

## 2023-02-19 DIAGNOSIS — M179 Osteoarthritis of knee, unspecified: Secondary | ICD-10-CM | POA: Diagnosis not present

## 2023-02-19 DIAGNOSIS — Z79891 Long term (current) use of opiate analgesic: Secondary | ICD-10-CM | POA: Diagnosis not present

## 2023-02-19 DIAGNOSIS — G894 Chronic pain syndrome: Secondary | ICD-10-CM | POA: Diagnosis not present

## 2023-02-19 DIAGNOSIS — M47816 Spondylosis without myelopathy or radiculopathy, lumbar region: Secondary | ICD-10-CM | POA: Diagnosis not present

## 2023-02-19 NOTE — Progress Notes (Signed)
Cardiology Office Note:  .   Date:  02/20/2023  ID:  [name] [name], DOB 02-11-1956, MRN [id number] PCP: Street, Stephanie Coup, MD  [facility] Providers Cardiologist:  Norman Herrlich, MD    History of Present Illness: .   [name] [name] is a 67 y.o. male with a past medical history significant for hypertension, aortic atherosclerosis, ED, emphysema, history of closed head injury, gout, BPH, dyslipidemia, history of pulmonary embolus.  He established care with Dr. Dulce Sellar on 01/30/2023 for the past of his PCP for evaluation of shortness of breath of uncertain etiology.  Previous history of unprovoked pulmonary embolism and had been off anticoagulants x 1 year.  He was restarted on anticoagulant and a CTA was arranged which revealed no evidence of pulmonary embolism but did reveal scattered coronary artery calcifications.  Echo completed on 02/11/2023 EF 60 to 65%, mild AR without stenosis.  Lexiscan was arranged and completed on 02/10/2023, normal, low risk study.  He presents today for follow-up, states he is doing well overall, he does have occasional palpitations.  He feels like his shortness of breath has subsided some, states he has good days and bad days with his breathing.  He has plans to see his PCP later today to check on his hyponatremia.  He plans to discuss with him if he needs to consider with long-term anticoagulation due to history of pulmonary embolism. He denies chest pain, dyspnea, pnd, orthopnea, n, v, dizziness, syncope, edema, weight gain, or early satiety.   ROS: Review of Systems  Constitutional: Negative.   HENT: Negative.    Eyes: Negative.   Respiratory: Negative.    Cardiovascular: Negative.   Gastrointestinal: Negative.   Genitourinary: Negative.   Musculoskeletal:  Positive for back pain.  Skin: Negative.   Neurological: Negative.   Endo/Heme/Allergies: Negative.   Psychiatric/Behavioral: Negative.        Studies Reviewed: .        Cardiac Studies  & Procedures     STRESS TESTS  MYOCARDIAL PERFUSION IMAGING 02/10/2023  Narrative   The study is normal. The study is low risk.   Left ventricular function is normal. Nuclear stress EF: 59 %. The left ventricular ejection fraction is normal (55-65%). End diastolic cavity size is normal.   Prior study available for comparison from 06/28/2021.   ECHOCARDIOGRAM  ECHOCARDIOGRAM COMPLETE 02/11/2023  Narrative ECHOCARDIOGRAM REPORT    Patient Name:   [name] [name] Date of Exam: 02/11/2023 Medical Rec #:  [id number]        Height:       69.0 in Accession #:    [id number]       Weight:       197.4 lb Date of Birth:  [birth year]        BSA:          2.055 m Patient Age:    67 years         BP:           130/78 mmHg Patient Gender: M                HR:           60 bpm. Exam Location:  [facility]  Procedure: 2D Echo, Cardiac Doppler, Color Doppler and Strain Analysis  Indications:    Chest pain of uncertain etiology [R07.9 (ICD-10-CM)]; Primary hypertension [I10 (ICD-10-CM)]; Mixed hyperlipidemia [E78.2 (ICD-10-CM)]; Coronary artery calcification [I25.10, I25.84 (ICD-10-CM)]  History:        Patient has prior  history of Echocardiogram examinations, most recent 07/24/2021. Aortic atherosclerosis.  Sonographer:    Louie Boston RDCS Referring Phys: [id number] BRIAN J MUNLEY  IMPRESSIONS   1. Left ventricular ejection fraction, by estimation, is 60 to 65%. The left ventricle has normal function. The left ventricle has no regional wall motion abnormalities. Left ventricular diastolic parameters were normal. The average left ventricular global longitudinal strain is -24.5 %. The global longitudinal strain is normal. 2. Right ventricular systolic function is normal. The right ventricular size is normal. There is normal pulmonary artery systolic pressure. 3. The mitral valve is normal in structure. No evidence of mitral valve regurgitation. No evidence of mitral stenosis. 4. The aortic valve is  normal in structure. Aortic valve regurgitation is mild. No aortic stenosis is present. 5. The inferior vena cava is normal in size with greater than 50% respiratory variability, suggesting right atrial pressure of 3 mmHg.  FINDINGS Left Ventricle: Left ventricular ejection fraction, by estimation, is 60 to 65%. The left ventricle has normal function. The left ventricle has no regional wall motion abnormalities. The average left ventricular global longitudinal strain is -24.5 %. The global longitudinal strain is normal. The left ventricular internal cavity size was normal in size. There is borderline left ventricular hypertrophy. Left ventricular diastolic parameters were normal.  Right Ventricle: The right ventricular size is normal. No increase in right ventricular wall thickness. Right ventricular systolic function is normal. There is normal pulmonary artery systolic pressure. The tricuspid regurgitant velocity is 2.59 m/s, and with an assumed right atrial pressure of 3 mmHg, the estimated right ventricular systolic pressure is 29.8 mmHg.  Left Atrium: Left atrial size was normal in size.  Right Atrium: Right atrial size was normal in size.  Pericardium: There is no evidence of pericardial effusion.  Mitral Valve: The mitral valve is normal in structure. No evidence of mitral valve regurgitation. No evidence of mitral valve stenosis.  Tricuspid Valve: The tricuspid valve is normal in structure. Tricuspid valve regurgitation is mild . No evidence of tricuspid stenosis.  Aortic Valve: The aortic valve is normal in structure. Aortic valve regurgitation is mild. Aortic regurgitation PHT measures 652 msec. No aortic stenosis is present.  Pulmonic Valve: The pulmonic valve was normal in structure. Pulmonic valve regurgitation is not visualized. No evidence of pulmonic stenosis.  Aorta: The aortic root is normal in size and structure.  Venous: The inferior vena cava is normal in size with  greater than 50% respiratory variability, suggesting right atrial pressure of 3 mmHg.  IAS/Shunts: No atrial level shunt detected by color flow Doppler.   LEFT VENTRICLE PLAX 2D LVIDd:         4.00 cm   Diastology LVIDs:         2.90 cm   LV e' medial:    7.07 cm/s LV PW:         1.20 cm   LV E/e' medial:  10.7 LV IVS:        1.10 cm   LV e' lateral:   8.70 cm/s LVOT diam:     2.00 cm   LV E/e' lateral: 8.7 LV SV:         79 LV SV Index:   38        2D Longitudinal Strain LVOT Area:     3.14 cm  2D Strain GLS Avg:     -24.5 %   RIGHT VENTRICLE             IVC RV  Basal diam:  3.00 cm     IVC diam: 1.50 cm RV S prime:     14.90 cm/s TAPSE (M-mode): 2.2 cm  LEFT ATRIUM             Index        RIGHT ATRIUM           Index LA diam:        3.50 cm 1.70 cm/m   RA Area:     16.90 cm LA Vol (A2C):   60.0 ml 29.20 ml/m  RA Volume:   45.00 ml  21.90 ml/m LA Vol (A4C):   65.9 ml 32.08 ml/m LA Biplane Vol: 63.3 ml 30.81 ml/m AORTIC VALVE LVOT Vmax:   116.50 cm/s LVOT Vmean:  76.000 cm/s LVOT VTI:    0.250 m AI PHT:      652 msec  AORTA Ao Root diam: 3.50 cm Ao STJ diam:  2.0 cm Ao Asc diam:  3.60 cm Ao Desc diam: 2.50 cm  MV E velocity: 76.00 cm/s  TRICUSPID VALVE MV A velocity: 74.40 cm/s  TR Peak grad:   26.8 mmHg MV E/A ratio:  1.02        TR Vmax:        259.00 cm/s  SHUNTS Systemic VTI:  0.25 m Systemic Diam: 2.00 cm  Gypsy Balsam MD Electronically signed by Gypsy Balsam MD Signature Date/Time: 02/11/2023/3:52:46 PM    Final             Risk Assessment/Calculations:             Physical Exam:   VS:  BP 138/88 (BP Location: Right Arm, Patient Position: Sitting, Cuff Size: Normal)   Pulse (!) 52   Ht 5\' 9"  (1.753 m)   Wt 195 lb 9.6 oz (88.7 kg)   SpO2 97%   BMI 28.89 kg/m    Wt Readings from Last 3 Encounters:  02/20/23 195 lb 9.6 oz (88.7 kg)  01/30/23 197 lb 6.4 oz (89.5 kg)  12/31/22 194 lb (88 kg)    GEN: Well nourished, well developed  in no acute distress NECK: No JVD; No carotid bruits CARDIAC: RRR, no murmurs, rubs, gallops RESPIRATORY:  Clear to auscultation without rales, wheezing or rhonchi  ABDOMEN: Soft, non-tender, non-distended EXTREMITIES:  No edema; No deformity   ASSESSMENT AND PLAN: .   Coronary artery calcifications-noted on CT imaging, Stable with no anginal symptoms. No indication for ischemic evaluation.  Currently on Eliquis for history of PE.  Continue metoprolol 50 mg daily.  Palpitations-these have been somewhat bothersome for him and seem to have increased with frequency.  Will arrange for a 7-day monitor.  Recent labs by PCP were unrevealing for sources of palpitations.  Hypertension-blood pressure is 138/88 today and marginally controlled, continue Norvasc 5 mg daily, continue metoprolol 50 mg daily.  If it is elevated at next office visit we can increase his Norvasc to 10 mg daily.  Hyperlipidemia-does not appear he has had a lipid panel done in some time, we will discuss this at his next OV.        Dispo: 7-day monitor, return in 3 months.  Signed, Flossie Dibble, NP

## 2023-02-20 ENCOUNTER — Ambulatory Visit: Payer: 59 | Attending: Cardiology | Admitting: Cardiology

## 2023-02-20 ENCOUNTER — Encounter: Payer: Self-pay | Admitting: Cardiology

## 2023-02-20 ENCOUNTER — Ambulatory Visit: Payer: 59 | Attending: Cardiology

## 2023-02-20 VITALS — BP 138/88 | HR 52 | Ht 69.0 in | Wt 195.6 lb

## 2023-02-20 DIAGNOSIS — R002 Palpitations: Secondary | ICD-10-CM | POA: Diagnosis not present

## 2023-02-20 DIAGNOSIS — I2510 Atherosclerotic heart disease of native coronary artery without angina pectoris: Secondary | ICD-10-CM | POA: Diagnosis not present

## 2023-02-20 DIAGNOSIS — E782 Mixed hyperlipidemia: Secondary | ICD-10-CM

## 2023-02-20 DIAGNOSIS — I2584 Coronary atherosclerosis due to calcified coronary lesion: Secondary | ICD-10-CM

## 2023-02-20 DIAGNOSIS — I10 Essential (primary) hypertension: Secondary | ICD-10-CM | POA: Diagnosis not present

## 2023-02-20 DIAGNOSIS — E871 Hypo-osmolality and hyponatremia: Secondary | ICD-10-CM | POA: Diagnosis not present

## 2023-02-20 NOTE — Patient Instructions (Signed)
Medication Instructions:  Your physician recommends that you continue on your current medications as directed. Please refer to the Current Medication list given to you today.  *If you need a refill on your cardiac medications before your next appointment, please call your pharmacy*   Lab Work: NONE If you have labs (blood work) drawn today and your tests are completely normal, you will receive your results only by: MyChart Message (if you have MyChart) OR A paper copy in the mail If you have any lab test that is abnormal or we need to change your treatment, we will call you to review the results.   Testing/Procedures: You have been asked to wear a Zio Heart Monitor today. It is to be worn for 7 days. Please remove the monitor on 7/5 and mail back in the box provided.  If you have any questions about the monitor please call the company at [phone]     Follow-Up: At [facility], you and your health needs are our priority.  As part of our continuing mission to provide you with exceptional heart care, we have created designated Provider Care Teams.  These Care Teams include your primary Cardiologist (physician) and Advanced Practice Providers (APPs -  Physician Assistants and Nurse Practitioners) who all work together to provide you with the care you need, when you need it.  We recommend signing up for the patient portal called "MyChart".  Sign up information is provided on this After Visit Summary.  MyChart is used to connect with patients for Virtual Visits (Telemedicine).  Patients are able to view lab/test results, encounter notes, upcoming appointments, etc.  Non-urgent messages can be sent to your provider as well.   To learn more about what you can do with MyChart, go to [email or web address].    Your next appointment:   3 month(s)  Provider:   Wallis Bamberg, NP Rosalita Levan)    Other Instructions

## 2023-03-10 ENCOUNTER — Telehealth: Payer: Self-pay | Admitting: Cardiology

## 2023-03-10 DIAGNOSIS — R002 Palpitations: Secondary | ICD-10-CM | POA: Diagnosis not present

## 2023-03-10 NOTE — Telephone Encounter (Signed)
Patient calling in bout heart monitor results. Please advise

## 2023-03-11 NOTE — Telephone Encounter (Signed)
Called patient and he was asking about the results of his heart monitor. I explained that it had not been interpreted at this time but I would send Dr. Dulce Sellar a note regarding the interpretation of the heart monitor. Patient was agreeable with this plan and had no further questions at this time.

## 2023-03-12 DIAGNOSIS — M4326 Fusion of spine, lumbar region: Secondary | ICD-10-CM | POA: Diagnosis not present

## 2023-03-12 DIAGNOSIS — M5116 Intervertebral disc disorders with radiculopathy, lumbar region: Secondary | ICD-10-CM | POA: Diagnosis not present

## 2023-03-12 NOTE — Telephone Encounter (Signed)
Called patient and informed him of Dr. Thersa Salt recommendation below:  "I reviewed his monitor today overall it is quite good at times he is aware of extra beats but there is no severe or sustained problem in his heart rhythm and the overall frequency is rare.  I do not think he requires any additional medications"  Patient verbalized understanding and had no further questions at this time.

## 2023-03-13 ENCOUNTER — Telehealth: Payer: Self-pay

## 2023-03-13 MED ORDER — METOPROLOL SUCCINATE ER 100 MG PO TB24: 100.0000 mg | ORAL_TABLET | Freq: Every day | ORAL | 3 refills | Status: DC

## 2023-03-13 NOTE — Telephone Encounter (Signed)
Spoke with pt regarding monitor results per [facility] note. Pt agreed to try increasing Metoptolol to 100mg  daily to see if this helps his fast heart rate. Sent Metoprolol 100mg  to pharmacy. Routed to PCP

## 2023-03-16 ENCOUNTER — Other Ambulatory Visit: Payer: Self-pay | Admitting: Orthopaedic Surgery

## 2023-03-16 DIAGNOSIS — M5116 Intervertebral disc disorders with radiculopathy, lumbar region: Secondary | ICD-10-CM

## 2023-03-26 DIAGNOSIS — M47816 Spondylosis without myelopathy or radiculopathy, lumbar region: Secondary | ICD-10-CM | POA: Diagnosis not present

## 2023-03-26 DIAGNOSIS — G894 Chronic pain syndrome: Secondary | ICD-10-CM | POA: Diagnosis not present

## 2023-03-26 DIAGNOSIS — Z1389 Encounter for screening for other disorder: Secondary | ICD-10-CM | POA: Diagnosis not present

## 2023-03-26 DIAGNOSIS — Z79891 Long term (current) use of opiate analgesic: Secondary | ICD-10-CM | POA: Diagnosis not present

## 2023-03-26 DIAGNOSIS — M5136 Other intervertebral disc degeneration, lumbar region: Secondary | ICD-10-CM | POA: Diagnosis not present

## 2023-03-27 ENCOUNTER — Ambulatory Visit
Admission: RE | Admit: 2023-03-27 | Discharge: 2023-03-27 | Disposition: A | Discharge status: Home / Self Care | Payer: 59 | Source: Ambulatory Visit | Attending: Orthopaedic Surgery | Admitting: Orthopaedic Surgery

## 2023-03-27 DIAGNOSIS — M5116 Intervertebral disc disorders with radiculopathy, lumbar region: Secondary | ICD-10-CM

## 2023-03-27 DIAGNOSIS — M47816 Spondylosis without myelopathy or radiculopathy, lumbar region: Secondary | ICD-10-CM | POA: Diagnosis not present

## 2023-03-30 DIAGNOSIS — G720 Drug-induced myopathy: Secondary | ICD-10-CM | POA: Diagnosis not present

## 2023-03-30 DIAGNOSIS — Z0000 Encounter for general adult medical examination without abnormal findings: Secondary | ICD-10-CM | POA: Diagnosis not present

## 2023-03-30 DIAGNOSIS — M4727 Other spondylosis with radiculopathy, lumbosacral region: Secondary | ICD-10-CM | POA: Diagnosis not present

## 2023-03-30 DIAGNOSIS — N138 Other obstructive and reflux uropathy: Secondary | ICD-10-CM | POA: Diagnosis not present

## 2023-03-30 DIAGNOSIS — Z79899 Other long term (current) drug therapy: Secondary | ICD-10-CM | POA: Diagnosis not present

## 2023-03-30 DIAGNOSIS — E871 Hypo-osmolality and hyponatremia: Secondary | ICD-10-CM | POA: Diagnosis not present

## 2023-03-30 DIAGNOSIS — E785 Hyperlipidemia, unspecified: Secondary | ICD-10-CM | POA: Diagnosis not present

## 2023-03-30 DIAGNOSIS — I25119 Atherosclerotic heart disease of native coronary artery with unspecified angina pectoris: Secondary | ICD-10-CM | POA: Diagnosis not present

## 2023-03-30 DIAGNOSIS — I10 Essential (primary) hypertension: Secondary | ICD-10-CM | POA: Diagnosis not present

## 2023-03-30 DIAGNOSIS — M1990 Unspecified osteoarthritis, unspecified site: Secondary | ICD-10-CM | POA: Diagnosis not present

## 2023-04-09 DIAGNOSIS — M47816 Spondylosis without myelopathy or radiculopathy, lumbar region: Secondary | ICD-10-CM | POA: Diagnosis not present

## 2023-04-09 DIAGNOSIS — M4326 Fusion of spine, lumbar region: Secondary | ICD-10-CM | POA: Diagnosis not present

## 2023-04-23 DIAGNOSIS — M47816 Spondylosis without myelopathy or radiculopathy, lumbar region: Secondary | ICD-10-CM | POA: Diagnosis not present

## 2023-04-23 DIAGNOSIS — Z79891 Long term (current) use of opiate analgesic: Secondary | ICD-10-CM | POA: Diagnosis not present

## 2023-04-23 DIAGNOSIS — M179 Osteoarthritis of knee, unspecified: Secondary | ICD-10-CM | POA: Diagnosis not present

## 2023-04-23 DIAGNOSIS — G894 Chronic pain syndrome: Secondary | ICD-10-CM | POA: Diagnosis not present

## 2023-04-23 DIAGNOSIS — M5136 Other intervertebral disc degeneration, lumbar region: Secondary | ICD-10-CM | POA: Diagnosis not present

## 2023-04-23 DIAGNOSIS — Z1389 Encounter for screening for other disorder: Secondary | ICD-10-CM | POA: Diagnosis not present

## 2023-04-28 DIAGNOSIS — M47816 Spondylosis without myelopathy or radiculopathy, lumbar region: Secondary | ICD-10-CM | POA: Diagnosis not present

## 2023-05-24 NOTE — Progress Notes (Deleted)
Cardiology Office Note:  .   Date:  05/24/2023  ID:  [name] [name], DOB 03-01-1956, MRN [id number] PCP: Street, Stephanie Coup, MD  [facility] Providers Cardiologist:  Norman Herrlich, MD    History of Present Illness: .   [name] [name] is a 67 y.o. male with a past medical history significant for hypertension, aortic atherosclerosis, ED, emphysema, history of closed head injury, gout, BPH, dyslipidemia, history of pulmonary embolus.  03/12/2023 monitor average heart rate 61 bpm, predominant underlying rhythm was sinus, 45 triggers events were all sinus, 14 episodes of SVT. 02/11/2023 echo EF 60 to 65%, mild AR without stenosis 02/10/2023 Lexiscan normal, low risk study 1624 CT of chest negative for PE, scattered coronary artery calcifications  He established care with Dr. Dulce Sellar on 01/30/2023 at the behest of his PCP for evaluation of shortness of breath of uncertain etiology.  Previous history of unprovoked pulmonary embolism and had been off anticoagulants x 1 year.  He was restarted on anticoagulant and a CTA was arranged which revealed no evidence of pulmonary embolism but did reveal scattered coronary artery calcifications.   He presents today for follow-up, states he is doing well overall, he does have occasional palpitations.  He feels like his shortness of breath has subsided some, states he has good days and bad days with his breathing.  He has plans to see his PCP later today to check on his hyponatremia.  He plans to discuss with him if he needs to consider with long-term anticoagulation due to history of pulmonary embolism. He denies chest pain, dyspnea, pnd, orthopnea, n, v, dizziness, syncope, edema, weight gain, or early satiety.   ROS: Review of Systems  Constitutional: Negative.   HENT: Negative.    Eyes: Negative.   Respiratory: Negative.    Cardiovascular: Negative.   Gastrointestinal: Negative.   Genitourinary: Negative.   Musculoskeletal:  Positive for back  pain.  Skin: Negative.   Neurological: Negative.   Endo/Heme/Allergies: Negative.   Psychiatric/Behavioral: Negative.        Studies Reviewed: .        Cardiac Studies & Procedures     STRESS TESTS  MYOCARDIAL PERFUSION IMAGING 02/10/2023  Narrative   The study is normal. The study is low risk.   Left ventricular function is normal. Nuclear stress EF: 59 %. The left ventricular ejection fraction is normal (55-65%). End diastolic cavity size is normal.   Prior study available for comparison from 06/28/2021.   ECHOCARDIOGRAM  ECHOCARDIOGRAM COMPLETE 02/11/2023  Narrative ECHOCARDIOGRAM REPORT    Patient Name:   [name] [name] Date of Exam: 02/11/2023 Medical Rec #:  [id number]        Height:       69.0 in Accession #:    [id number]       Weight:       197.4 lb Date of Birth:  01/01/1956        BSA:          2.055 m Patient Age:    67 years         BP:           130/78 mmHg Patient Gender: M                HR:           60 bpm. Exam Location:  [place]  Procedure: 2D Echo, Cardiac Doppler, Color Doppler and Strain Analysis  Indications:    Chest pain of uncertain etiology [R07.9 (ICD-10-CM)];  Primary hypertension [I10 (ICD-10-CM)]; Mixed hyperlipidemia [E78.2 (ICD-10-CM)]; Coronary artery calcification [I25.10, I25.84 (ICD-10-CM)]  History:        Patient has prior history of Echocardiogram examinations, most recent 07/24/2021. Aortic atherosclerosis.  Sonographer:    Louie Boston RDCS Referring Phys: [id number] BRIAN J MUNLEY  IMPRESSIONS   1. Left ventricular ejection fraction, by estimation, is 60 to 65%. The left ventricle has normal function. The left ventricle has no regional wall motion abnormalities. Left ventricular diastolic parameters were normal. The average left ventricular global longitudinal strain is -24.5 %. The global longitudinal strain is normal. 2. Right ventricular systolic function is normal. The right ventricular size is normal. There is normal  pulmonary artery systolic pressure. 3. The mitral valve is normal in structure. No evidence of mitral valve regurgitation. No evidence of mitral stenosis. 4. The aortic valve is normal in structure. Aortic valve regurgitation is mild. No aortic stenosis is present. 5. The inferior vena cava is normal in size with greater than 50% respiratory variability, suggesting right atrial pressure of 3 mmHg.  FINDINGS Left Ventricle: Left ventricular ejection fraction, by estimation, is 60 to 65%. The left ventricle has normal function. The left ventricle has no regional wall motion abnormalities. The average left ventricular global longitudinal strain is -24.5 %. The global longitudinal strain is normal. The left ventricular internal cavity size was normal in size. There is borderline left ventricular hypertrophy. Left ventricular diastolic parameters were normal.  Right Ventricle: The right ventricular size is normal. No increase in right ventricular wall thickness. Right ventricular systolic function is normal. There is normal pulmonary artery systolic pressure. The tricuspid regurgitant velocity is 2.59 m/s, and with an assumed right atrial pressure of 3 mmHg, the estimated right ventricular systolic pressure is 29.8 mmHg.  Left Atrium: Left atrial size was normal in size.  Right Atrium: Right atrial size was normal in size.  Pericardium: There is no evidence of pericardial effusion.  Mitral Valve: The mitral valve is normal in structure. No evidence of mitral valve regurgitation. No evidence of mitral valve stenosis.  Tricuspid Valve: The tricuspid valve is normal in structure. Tricuspid valve regurgitation is mild . No evidence of tricuspid stenosis.  Aortic Valve: The aortic valve is normal in structure. Aortic valve regurgitation is mild. Aortic regurgitation PHT measures 652 msec. No aortic stenosis is present.  Pulmonic Valve: The pulmonic valve was normal in structure. Pulmonic valve  regurgitation is not visualized. No evidence of pulmonic stenosis.  Aorta: The aortic root is normal in size and structure.  Venous: The inferior vena cava is normal in size with greater than 50% respiratory variability, suggesting right atrial pressure of 3 mmHg.  IAS/Shunts: No atrial level shunt detected by color flow Doppler.   LEFT VENTRICLE PLAX 2D LVIDd:         4.00 cm   Diastology LVIDs:         2.90 cm   LV e' medial:    7.07 cm/s LV PW:         1.20 cm   LV E/e' medial:  10.7 LV IVS:        1.10 cm   LV e' lateral:   8.70 cm/s LVOT diam:     2.00 cm   LV E/e' lateral: 8.7 LV SV:         79 LV SV Index:   38        2D Longitudinal Strain LVOT Area:     3.14 cm  2D Strain  GLS Avg:     -24.5 %   RIGHT VENTRICLE             IVC RV Basal diam:  3.00 cm     IVC diam: 1.50 cm RV S prime:     14.90 cm/s TAPSE (M-mode): 2.2 cm  LEFT ATRIUM             Index        RIGHT ATRIUM           Index LA diam:        3.50 cm 1.70 cm/m   RA Area:     16.90 cm LA Vol (A2C):   60.0 ml 29.20 ml/m  RA Volume:   45.00 ml  21.90 ml/m LA Vol (A4C):   65.9 ml 32.08 ml/m LA Biplane Vol: 63.3 ml 30.81 ml/m AORTIC VALVE LVOT Vmax:   116.50 cm/s LVOT Vmean:  76.000 cm/s LVOT VTI:    0.250 m AI PHT:      652 msec  AORTA Ao Root diam: 3.50 cm Ao STJ diam:  2.0 cm Ao Asc diam:  3.60 cm Ao Desc diam: 2.50 cm  MV E velocity: 76.00 cm/s  TRICUSPID VALVE MV A velocity: 74.40 cm/s  TR Peak grad:   26.8 mmHg MV E/A ratio:  1.02        TR Vmax:        259.00 cm/s  SHUNTS Systemic VTI:  0.25 m Systemic Diam: 2.00 cm  Gypsy Balsam MD Electronically signed by Gypsy Balsam MD Signature Date/Time: 02/11/2023/3:52:46 PM    Final    MONITORS  LONG TERM MONITOR (3-14 DAYS) 03/12/2023  Narrative Patch Wear Time:  7 days and 2 hours (2024-06-28T09:54:24-0400 to 2024-07-05T12:52:57-0400)  Patient had a min HR of 41 bpm, max HR of 142 bpm in sinus rhythm, and avg HR of 61 bpm.  Predominant underlying rhythm was Sinus Rhythm.  There were no sinus pauses of 3 seconds or greater and no episodes of second or third-degree AV nodal block.  There were 45 triggered events all sinus rhythm, at times associated with occasional PVCs and APCs but no episodes of atrial fibrillation or flutter.  The majority of these events were sinus rhythm without ectopy.  14 Supraventricular Tachycardia runs occurred, the run with the fastest interval lasting 6 beats with a max rate of 200 bpm, the longest lasting 14.4 secs with an avg rate of 96 bpm. .  Isolated SVEs were rare (<1.0%), SVE Couplets were rare (<1.0%), and SVE Triplets were rare (<1.0%).  There were no signs of atrial fibrillation or flutter  Isolated VEs were rare (<1.0%), VE Couplets were rare (<1.0%), and no VE Triplets were present. Ventricular Bigeminy and Trigeminy were present.           Risk Assessment/Calculations:     No BP recorded.  {Refresh Note OR Click here to enter BP  :1}***       Physical Exam:   VS:  There were no vitals taken for this visit.   Wt Readings from Last 3 Encounters:  02/20/23 195 lb 9.6 oz (88.7 kg)  01/30/23 197 lb 6.4 oz (89.5 kg)  12/31/22 194 lb (88 kg)    GEN: Well nourished, well developed in no acute distress NECK: No JVD; No carotid bruits CARDIAC: RRR, no murmurs, rubs, gallops RESPIRATORY:  Clear to auscultation without rales, wheezing or rhonchi  ABDOMEN: Soft, non-tender, non-distended EXTREMITIES:  No edema; No deformity   ASSESSMENT AND PLAN: .  Coronary artery calcifications-noted on CT imaging, Stable with no anginal symptoms. No indication for ischemic evaluation.  Currently on Eliquis for history of PE.  Continue metoprolol 50 mg daily.  Palpitations-these have been somewhat bothersome for him and seem to have increased with frequency.  Will arrange for a 7-day monitor.  Recent labs by PCP were unrevealing for sources of palpitations.  Hypertension-blood pressure  is 138/88 today and marginally controlled, continue Norvasc 5 mg daily, continue metoprolol 50 mg daily.  If it is elevated at next office visit we can increase his Norvasc to 10 mg daily.  Hyperlipidemia-does not appear he has had a lipid panel done in some time, we will discuss this at his next OV.        Dispo: 7-day monitor, return in 3 months.  Signed, Flossie Dibble, NP

## 2023-05-25 ENCOUNTER — Ambulatory Visit: Payer: 59 | Attending: Cardiology | Admitting: Cardiology

## 2023-05-25 DIAGNOSIS — G894 Chronic pain syndrome: Secondary | ICD-10-CM | POA: Diagnosis not present

## 2023-05-25 DIAGNOSIS — M47816 Spondylosis without myelopathy or radiculopathy, lumbar region: Secondary | ICD-10-CM | POA: Diagnosis not present

## 2023-05-25 DIAGNOSIS — Z79891 Long term (current) use of opiate analgesic: Secondary | ICD-10-CM | POA: Diagnosis not present

## 2023-05-26 DIAGNOSIS — M47816 Spondylosis without myelopathy or radiculopathy, lumbar region: Secondary | ICD-10-CM | POA: Diagnosis not present

## 2023-06-01 DIAGNOSIS — E559 Vitamin D deficiency, unspecified: Secondary | ICD-10-CM | POA: Diagnosis not present

## 2023-06-01 DIAGNOSIS — M6208 Separation of muscle (nontraumatic), other site: Secondary | ICD-10-CM | POA: Diagnosis not present

## 2023-06-01 DIAGNOSIS — R5383 Other fatigue: Secondary | ICD-10-CM | POA: Diagnosis not present

## 2023-06-08 DIAGNOSIS — M47816 Spondylosis without myelopathy or radiculopathy, lumbar region: Secondary | ICD-10-CM | POA: Diagnosis not present

## 2023-06-11 DIAGNOSIS — M6208 Separation of muscle (nontraumatic), other site: Secondary | ICD-10-CM | POA: Diagnosis not present

## 2023-06-26 DIAGNOSIS — M47816 Spondylosis without myelopathy or radiculopathy, lumbar region: Secondary | ICD-10-CM | POA: Diagnosis not present

## 2023-06-26 DIAGNOSIS — Z79891 Long term (current) use of opiate analgesic: Secondary | ICD-10-CM | POA: Diagnosis not present

## 2023-06-26 DIAGNOSIS — G894 Chronic pain syndrome: Secondary | ICD-10-CM | POA: Diagnosis not present

## 2023-06-26 DIAGNOSIS — M179 Osteoarthritis of knee, unspecified: Secondary | ICD-10-CM | POA: Diagnosis not present

## 2023-07-09 DIAGNOSIS — M791 Myalgia, unspecified site: Secondary | ICD-10-CM | POA: Diagnosis not present

## 2023-07-09 DIAGNOSIS — M47816 Spondylosis without myelopathy or radiculopathy, lumbar region: Secondary | ICD-10-CM | POA: Diagnosis not present

## 2023-08-06 DIAGNOSIS — G894 Chronic pain syndrome: Secondary | ICD-10-CM | POA: Diagnosis not present

## 2023-08-06 DIAGNOSIS — Z79891 Long term (current) use of opiate analgesic: Secondary | ICD-10-CM | POA: Diagnosis not present

## 2023-08-06 DIAGNOSIS — M4326 Fusion of spine, lumbar region: Secondary | ICD-10-CM | POA: Diagnosis not present

## 2023-08-06 DIAGNOSIS — M47816 Spondylosis without myelopathy or radiculopathy, lumbar region: Secondary | ICD-10-CM | POA: Diagnosis not present

## 2023-08-06 DIAGNOSIS — M5416 Radiculopathy, lumbar region: Secondary | ICD-10-CM | POA: Diagnosis not present

## 2023-08-06 DIAGNOSIS — M179 Osteoarthritis of knee, unspecified: Secondary | ICD-10-CM | POA: Diagnosis not present

## 2023-08-17 DIAGNOSIS — M5416 Radiculopathy, lumbar region: Secondary | ICD-10-CM | POA: Diagnosis not present

## 2023-09-02 DIAGNOSIS — E871 Hypo-osmolality and hyponatremia: Secondary | ICD-10-CM | POA: Diagnosis not present

## 2023-09-02 DIAGNOSIS — Z7901 Long term (current) use of anticoagulants: Secondary | ICD-10-CM | POA: Diagnosis not present

## 2023-09-02 DIAGNOSIS — H5509 Other forms of nystagmus: Secondary | ICD-10-CM | POA: Diagnosis not present

## 2023-09-02 DIAGNOSIS — R2689 Other abnormalities of gait and mobility: Secondary | ICD-10-CM | POA: Diagnosis not present

## 2023-09-02 DIAGNOSIS — R251 Tremor, unspecified: Secondary | ICD-10-CM | POA: Diagnosis not present

## 2023-09-02 DIAGNOSIS — R42 Dizziness and giddiness: Secondary | ICD-10-CM | POA: Diagnosis not present

## 2023-09-02 DIAGNOSIS — R109 Unspecified abdominal pain: Secondary | ICD-10-CM | POA: Diagnosis not present

## 2023-09-02 DIAGNOSIS — G9389 Other specified disorders of brain: Secondary | ICD-10-CM | POA: Diagnosis not present

## 2023-09-02 DIAGNOSIS — R2681 Unsteadiness on feet: Secondary | ICD-10-CM | POA: Diagnosis not present

## 2023-09-02 DIAGNOSIS — J449 Chronic obstructive pulmonary disease, unspecified: Secondary | ICD-10-CM | POA: Diagnosis not present

## 2023-09-02 DIAGNOSIS — R9089 Other abnormal findings on diagnostic imaging of central nervous system: Secondary | ICD-10-CM | POA: Diagnosis not present

## 2023-09-02 DIAGNOSIS — Z86711 Personal history of pulmonary embolism: Secondary | ICD-10-CM | POA: Diagnosis not present

## 2023-09-02 DIAGNOSIS — M545 Low back pain, unspecified: Secondary | ICD-10-CM | POA: Diagnosis not present

## 2023-09-02 DIAGNOSIS — Z743 Need for continuous supervision: Secondary | ICD-10-CM | POA: Diagnosis not present

## 2023-09-02 DIAGNOSIS — Z79899 Other long term (current) drug therapy: Secondary | ICD-10-CM | POA: Diagnosis not present

## 2023-09-02 DIAGNOSIS — R5383 Other fatigue: Secondary | ICD-10-CM | POA: Diagnosis not present

## 2023-09-02 DIAGNOSIS — I10 Essential (primary) hypertension: Secondary | ICD-10-CM | POA: Diagnosis not present

## 2023-09-02 DIAGNOSIS — R404 Transient alteration of awareness: Secondary | ICD-10-CM | POA: Diagnosis not present

## 2023-09-02 DIAGNOSIS — G8929 Other chronic pain: Secondary | ICD-10-CM | POA: Diagnosis not present

## 2023-09-15 DIAGNOSIS — M47816 Spondylosis without myelopathy or radiculopathy, lumbar region: Secondary | ICD-10-CM | POA: Diagnosis not present

## 2023-09-15 DIAGNOSIS — M179 Osteoarthritis of knee, unspecified: Secondary | ICD-10-CM | POA: Diagnosis not present

## 2023-09-15 DIAGNOSIS — Z1389 Encounter for screening for other disorder: Secondary | ICD-10-CM | POA: Diagnosis not present

## 2023-09-19 DIAGNOSIS — R251 Tremor, unspecified: Secondary | ICD-10-CM | POA: Diagnosis not present

## 2023-10-28 DIAGNOSIS — M5416 Radiculopathy, lumbar region: Secondary | ICD-10-CM | POA: Diagnosis not present

## 2023-10-28 DIAGNOSIS — M4326 Fusion of spine, lumbar region: Secondary | ICD-10-CM | POA: Diagnosis not present

## 2023-11-06 DIAGNOSIS — G894 Chronic pain syndrome: Secondary | ICD-10-CM | POA: Diagnosis not present

## 2023-11-06 DIAGNOSIS — Z79891 Long term (current) use of opiate analgesic: Secondary | ICD-10-CM | POA: Diagnosis not present

## 2023-11-06 DIAGNOSIS — M47816 Spondylosis without myelopathy or radiculopathy, lumbar region: Secondary | ICD-10-CM | POA: Diagnosis not present

## 2023-11-06 DIAGNOSIS — M179 Osteoarthritis of knee, unspecified: Secondary | ICD-10-CM | POA: Diagnosis not present

## 2023-11-25 DIAGNOSIS — E785 Hyperlipidemia, unspecified: Secondary | ICD-10-CM | POA: Diagnosis not present

## 2023-11-25 DIAGNOSIS — G720 Drug-induced myopathy: Secondary | ICD-10-CM | POA: Diagnosis not present

## 2023-11-25 DIAGNOSIS — R1033 Periumbilical pain: Secondary | ICD-10-CM | POA: Diagnosis not present

## 2023-11-25 DIAGNOSIS — J418 Mixed simple and mucopurulent chronic bronchitis: Secondary | ICD-10-CM | POA: Diagnosis not present

## 2023-11-25 DIAGNOSIS — M6208 Separation of muscle (nontraumatic), other site: Secondary | ICD-10-CM | POA: Diagnosis not present

## 2023-11-25 DIAGNOSIS — I25119 Atherosclerotic heart disease of native coronary artery with unspecified angina pectoris: Secondary | ICD-10-CM | POA: Diagnosis not present

## 2023-11-25 DIAGNOSIS — I700 Atherosclerosis of aorta: Secondary | ICD-10-CM | POA: Diagnosis not present

## 2023-11-25 DIAGNOSIS — E871 Hypo-osmolality and hyponatremia: Secondary | ICD-10-CM | POA: Diagnosis not present

## 2023-11-26 DIAGNOSIS — M4326 Fusion of spine, lumbar region: Secondary | ICD-10-CM | POA: Diagnosis not present

## 2023-11-26 DIAGNOSIS — M5416 Radiculopathy, lumbar region: Secondary | ICD-10-CM | POA: Diagnosis not present

## 2023-11-26 DIAGNOSIS — G544 Lumbosacral root disorders, not elsewhere classified: Secondary | ICD-10-CM | POA: Diagnosis not present

## 2023-12-03 DIAGNOSIS — M179 Osteoarthritis of knee, unspecified: Secondary | ICD-10-CM | POA: Diagnosis not present

## 2023-12-03 DIAGNOSIS — M47816 Spondylosis without myelopathy or radiculopathy, lumbar region: Secondary | ICD-10-CM | POA: Diagnosis not present

## 2023-12-04 DIAGNOSIS — K5732 Diverticulitis of large intestine without perforation or abscess without bleeding: Secondary | ICD-10-CM | POA: Diagnosis not present

## 2023-12-04 DIAGNOSIS — R1033 Periumbilical pain: Secondary | ICD-10-CM | POA: Diagnosis not present

## 2024-01-13 DIAGNOSIS — G894 Chronic pain syndrome: Secondary | ICD-10-CM | POA: Diagnosis not present

## 2024-02-03 ENCOUNTER — Other Ambulatory Visit: Payer: Self-pay | Admitting: Cardiology

## 2024-02-03 DIAGNOSIS — Z86711 Personal history of pulmonary embolism: Secondary | ICD-10-CM

## 2024-02-03 NOTE — Telephone Encounter (Signed)
 Prescription refill request for Eliquis  received. Indication: PE Last office visit: 02/20/23 Marjory Signs)  Scr: 0.90 (09/03/23)  Age: 68 Weight: 88.7kg  Appropriate dose. Refill sent.

## 2024-02-10 DIAGNOSIS — G541 Lumbosacral plexus disorders: Secondary | ICD-10-CM | POA: Diagnosis not present

## 2024-02-15 DIAGNOSIS — G894 Chronic pain syndrome: Secondary | ICD-10-CM | POA: Diagnosis not present

## 2024-02-15 DIAGNOSIS — Z79891 Long term (current) use of opiate analgesic: Secondary | ICD-10-CM | POA: Diagnosis not present

## 2024-02-15 DIAGNOSIS — M47816 Spondylosis without myelopathy or radiculopathy, lumbar region: Secondary | ICD-10-CM | POA: Diagnosis not present

## 2024-02-23 DIAGNOSIS — H2513 Age-related nuclear cataract, bilateral: Secondary | ICD-10-CM | POA: Diagnosis not present

## 2024-03-17 DIAGNOSIS — M47816 Spondylosis without myelopathy or radiculopathy, lumbar region: Secondary | ICD-10-CM | POA: Diagnosis not present

## 2024-03-17 DIAGNOSIS — Z79891 Long term (current) use of opiate analgesic: Secondary | ICD-10-CM | POA: Diagnosis not present

## 2024-04-01 ENCOUNTER — Other Ambulatory Visit: Payer: Self-pay | Admitting: Cardiology

## 2024-04-11 DIAGNOSIS — Z981 Arthrodesis status: Secondary | ICD-10-CM | POA: Diagnosis not present

## 2024-04-11 DIAGNOSIS — M545 Low back pain, unspecified: Secondary | ICD-10-CM | POA: Diagnosis not present

## 2024-04-11 DIAGNOSIS — G8929 Other chronic pain: Secondary | ICD-10-CM | POA: Diagnosis not present

## 2024-04-26 DIAGNOSIS — M47816 Spondylosis without myelopathy or radiculopathy, lumbar region: Secondary | ICD-10-CM | POA: Diagnosis not present

## 2024-05-09 DIAGNOSIS — M791 Myalgia, unspecified site: Secondary | ICD-10-CM | POA: Diagnosis not present

## 2024-05-09 DIAGNOSIS — M5116 Intervertebral disc disorders with radiculopathy, lumbar region: Secondary | ICD-10-CM | POA: Diagnosis not present

## 2024-05-09 DIAGNOSIS — M545 Low back pain, unspecified: Secondary | ICD-10-CM | POA: Diagnosis not present

## 2024-05-09 DIAGNOSIS — G8929 Other chronic pain: Secondary | ICD-10-CM | POA: Diagnosis not present

## 2024-05-10 ENCOUNTER — Telehealth: Payer: Self-pay | Admitting: Cardiology

## 2024-05-10 DIAGNOSIS — I10 Essential (primary) hypertension: Secondary | ICD-10-CM

## 2024-05-10 DIAGNOSIS — Z86711 Personal history of pulmonary embolism: Secondary | ICD-10-CM

## 2024-05-10 NOTE — Telephone Encounter (Signed)
*  STAT* If patient is at the pharmacy, call can be transferred to refill team.   1. Which medications need to be refilled? (please list name of each medication and dose if known)   apixaban  (ELIQUIS ) 5 MG TABS tablet  metoprolol  succinate (TOPROL -XL) 100 MG 24 hr tablet   2. Would you like to learn more about the convenience, safety, & potential cost savings by using the [facility]?   3. Are you open to using the [facility] (Type [facility]. ).  4. Which pharmacy/location (including street and city if local pharmacy) is medication to be sent to?  [facility] - [place] [address]   5. Do they need a 30 day or 90 day supply?   90 day  Caller Wilbur) stated patient is transferring to [facility].

## 2024-05-11 MED ORDER — APIXABAN 5 MG PO TABS: 5.0000 mg | ORAL_TABLET | Freq: Two times a day (BID) | ORAL | 0 refills | Status: DC

## 2024-05-11 MED ORDER — METOPROLOL SUCCINATE ER 100 MG PO TB24: 100.0000 mg | ORAL_TABLET | Freq: Every day | ORAL | 0 refills | Status: DC

## 2024-05-13 NOTE — Telephone Encounter (Signed)
 [facility] for pt to cb to schedule.

## 2024-05-23 ENCOUNTER — Other Ambulatory Visit: Payer: Self-pay | Admitting: Cardiology

## 2024-05-23 DIAGNOSIS — I10 Essential (primary) hypertension: Secondary | ICD-10-CM

## 2024-06-06 DIAGNOSIS — M47816 Spondylosis without myelopathy or radiculopathy, lumbar region: Secondary | ICD-10-CM | POA: Diagnosis not present

## 2024-06-28 ENCOUNTER — Other Ambulatory Visit: Payer: Self-pay | Admitting: Cardiology

## 2024-06-28 DIAGNOSIS — I10 Essential (primary) hypertension: Secondary | ICD-10-CM

## 2024-07-12 ENCOUNTER — Other Ambulatory Visit: Payer: Self-pay | Admitting: Cardiology

## 2024-07-12 DIAGNOSIS — I10 Essential (primary) hypertension: Secondary | ICD-10-CM

## 2024-08-02 ENCOUNTER — Other Ambulatory Visit: Payer: Self-pay | Admitting: Cardiology

## 2024-08-02 DIAGNOSIS — Z86711 Personal history of pulmonary embolism: Secondary | ICD-10-CM

## 2024-08-02 NOTE — Telephone Encounter (Signed)
Left VM for patient to call back to schedule. CB ?

## 2024-08-02 NOTE — Telephone Encounter (Signed)
 Prescription refill request for Eliquis  received. Indication:palps Last office visit:needs appt Scr:  Age: Weight: Prescription refilled

## 2024-08-28 ENCOUNTER — Other Ambulatory Visit: Payer: Self-pay | Admitting: Cardiology

## 2024-08-28 DIAGNOSIS — Z86711 Personal history of pulmonary embolism: Secondary | ICD-10-CM

## 2024-08-29 ENCOUNTER — Telehealth: Payer: Self-pay

## 2024-08-29 NOTE — Telephone Encounter (Signed)
 Received the following message below:  Good evening,   Can you please reach out to the patient for an appointment.  He was last seen 01/2023 by Delon Hoover.   Thanks in advance,  Candance  Called the patient. Patient did not answer the phone and a message was left for the patient to call back.

## 2024-08-29 NOTE — Telephone Encounter (Addendum)
" °  Eliquis  5mg  refill request received. Patient is 69 years old, weight-88.7kg, Crea-0.90 on 1/9/25[facility]where-need labs, Diagnosis-PE, and last seen by Delon Hoover on6/28/24. Dose is appropriate based on dosing criteria.   Pt NEEDS AN APPT.  Last sent 08/02/24 with 30 day supply and a note. Also, see note on 05/10/24 Msg to Schedulers  "
